# Patient Record
Sex: Male | Born: 1935 | Race: White | Hispanic: No | Marital: Married | Smoking: Former smoker
Health system: Southern US, Community
[De-identification: ages and names within clinical notes are randomized; demographics above are authoritative.]

## PROBLEM LIST (undated history)

## (undated) HISTORY — PX: VASECTOMY: SHX75

---

## 2003-07-01 ENCOUNTER — Ambulatory Visit (HOSPITAL_COMMUNITY): Admission: RE | Admit: 2003-07-01 | Discharge: 2003-07-01 | Payer: Self-pay | Admitting: Gastroenterology

## 2003-07-01 ENCOUNTER — Encounter (INDEPENDENT_AMBULATORY_CARE_PROVIDER_SITE_OTHER): Payer: Self-pay | Admitting: *Deleted

## 2004-11-07 ENCOUNTER — Emergency Department (HOSPITAL_COMMUNITY): Admission: EM | Admit: 2004-11-07 | Discharge: 2004-11-07 | Payer: Self-pay | Admitting: Family Medicine

## 2007-09-09 ENCOUNTER — Encounter: Admission: RE | Admit: 2007-09-09 | Discharge: 2007-09-09 | Payer: Self-pay | Admitting: Internal Medicine

## 2009-03-14 ENCOUNTER — Encounter: Admission: RE | Admit: 2009-03-14 | Discharge: 2009-03-14 | Payer: Self-pay | Admitting: Internal Medicine

## 2010-09-28 NOTE — Op Note (Signed)
NAME:  Jorge Ball, Jorge Ball                       ACCOUNT NO.:  [id number]   MEDICAL RECORD NO.:  [id number]                   PATIENT TYPE:  AMB   LOCATION:  ENDO                                 FACILITY:  [facility]   PHYSICIAN:  Petra Kuba, M.D.                 DATE OF BIRTH:  1927/01/05   DATE OF PROCEDURE:  07/01/2003  DATE OF DISCHARGE:                                 OPERATIVE REPORT   PROCEDURE:  Colonoscopy with polypectomy.   INDICATION:  Screening.  Consent was signed after risks, benefits, methods,  options thoroughly discussed in the office.   MEDICINES USED:  1. Demerol 90.  2. Versed 9.   DESCRIPTION OF PROCEDURE:  Rectal inspection was pertinent for external  hemorrhoids, small.  Digital exam was negative.  The video pediatric  adjustable colonoscope was inserted and about the level of the splenic  flexure, at about 60 cm, there were lots of diverticula in a very tortuous  curvy area which required rolling him first on his back and then on his  right side to be able to be advanced through this area.  Once through this  area, we were able to easily advance around the colon to the cecum.  Some  abdominal pressure was needed.  Other than the left-sided significant  diverticula, no other abnormalities were seen on insertion.  The cecum was  identified by the appendiceal orifice and the ileocecal valve.  Prep was  adequate.  There was some liquid stool that required washing and suctioning.  The scope was slowly withdrawn.  The cecum, ascending, and majority of the  transverse were normal.  There was a scattered transverse diverticula.  In  the more distal and mid transverse, two tiny questionable polyps were seen  and were each cold biopsied x 1 or 2 and put in the first container.  The  scope was further withdrawn.  We did not get a great look at the tortuous  splenic flexure area since we fell back around it and had difficulty  readvancing through it again, but we elected  to withdraw.  Other than the  left-sided moderate to significant diverticula, no other abnormalities were  seen until we withdrew back to the proximal rectum where a small, semi-  pedunculated polyp was seen, snared, electrocautery applied, and the polyp  was suctioned through the scope and collected in the trap after removal.  There was a nice white coagulum without any obvious residual polypoid  tissue.  The scope was slowly withdrawn.  Anorectal pull-through and  retroflexion confirmed some small hemorrhoids.  Scope was reinserted a short  ways up the left side of the colon; air was suctioned, scope removed.  The  patient tolerated the procedure adequately.  There was no obvious immediate  complication.   ENDOSCOPIC DIAGNOSES:  1. Internal-external hemorrhoids.  2. Proximal rectal small polyps, snared.  3. Left-sided significant diverticula.  4.  Particularly tortuous area of approximately the splenic flexure, not well     seen due to tortuosity, diverticula, edema, etc.  5. Questionable tiny to distal and mid transverse polyps, cold biopsied, put     in a separate container.  6. Otherwise, within normal limits to the cecum.   PLAN:  1. Await pathology to determine future colonic screening.  He might be a     good candidate for a virtual colonoscopy in the future if widely accepted     and returns to cover.  2. Otherwise, happy to see back p.r.n.  3. Otherwise, return care to Dr. Chilton Si for the customary health care     maintenance to include yearly rectals and guaiacs.                                               Petra Kuba, M.D.    MEM/MEDQ  D:  07/01/2003  T:  07/01/2003  Job:  [id number]   cc:   Erskine Speed, M.D.  [address]  [place]  [place] [address]  Fax: [phone]

## 2011-05-13 ENCOUNTER — Other Ambulatory Visit: Payer: Self-pay | Admitting: Internal Medicine

## 2011-05-13 ENCOUNTER — Ambulatory Visit
Admission: RE | Admit: 2011-05-13 | Discharge: 2011-05-13 | Disposition: A | Discharge status: Home / Self Care | Payer: Medicare Other | Source: Ambulatory Visit | Attending: Internal Medicine | Admitting: Internal Medicine

## 2011-05-13 DIAGNOSIS — R05 Cough: Secondary | ICD-10-CM

## 2011-06-17 ENCOUNTER — Other Ambulatory Visit: Payer: Self-pay | Admitting: Internal Medicine

## 2011-06-17 ENCOUNTER — Ambulatory Visit
Admission: RE | Admit: 2011-06-17 | Discharge: 2011-06-17 | Disposition: A | Discharge status: Home / Self Care | Payer: Medicare Other | Source: Ambulatory Visit | Attending: Internal Medicine | Admitting: Internal Medicine

## 2011-06-17 DIAGNOSIS — J189 Pneumonia, unspecified organism: Secondary | ICD-10-CM

## 2011-11-03 ENCOUNTER — Inpatient Hospital Stay (HOSPITAL_COMMUNITY)
Admission: EM | Admit: 2011-11-03 | Discharge: 2011-11-06 | DRG: 690 | Disposition: A | Discharge status: Home / Self Care | Payer: Medicare Other | Source: Ambulatory Visit | Attending: Internal Medicine | Admitting: Internal Medicine

## 2011-11-03 ENCOUNTER — Emergency Department (HOSPITAL_COMMUNITY): Payer: Medicare Other

## 2011-11-03 ENCOUNTER — Encounter (HOSPITAL_COMMUNITY): Payer: Self-pay | Admitting: *Deleted

## 2011-11-03 DIAGNOSIS — R4182 Altered mental status, unspecified: Secondary | ICD-10-CM

## 2011-11-03 DIAGNOSIS — D696 Thrombocytopenia, unspecified: Secondary | ICD-10-CM | POA: Diagnosis present

## 2011-11-03 DIAGNOSIS — R509 Fever, unspecified: Secondary | ICD-10-CM | POA: Diagnosis present

## 2011-11-03 DIAGNOSIS — E119 Type 2 diabetes mellitus without complications: Secondary | ICD-10-CM

## 2011-11-03 DIAGNOSIS — R7402 Elevation of levels of lactic acid dehydrogenase (LDH): Secondary | ICD-10-CM | POA: Diagnosis present

## 2011-11-03 DIAGNOSIS — D509 Iron deficiency anemia, unspecified: Secondary | ICD-10-CM | POA: Diagnosis present

## 2011-11-03 DIAGNOSIS — Z9181 History of falling: Secondary | ICD-10-CM

## 2011-11-03 DIAGNOSIS — R112 Nausea with vomiting, unspecified: Secondary | ICD-10-CM

## 2011-11-03 DIAGNOSIS — R7401 Elevation of levels of liver transaminase levels: Secondary | ICD-10-CM | POA: Diagnosis present

## 2011-11-03 DIAGNOSIS — M311 Thrombotic microangiopathy: Secondary | ICD-10-CM

## 2011-11-03 DIAGNOSIS — E782 Mixed hyperlipidemia: Secondary | ICD-10-CM

## 2011-11-03 DIAGNOSIS — W19XXXA Unspecified fall, initial encounter: Secondary | ICD-10-CM | POA: Diagnosis present

## 2011-11-03 DIAGNOSIS — Z7982 Long term (current) use of aspirin: Secondary | ICD-10-CM

## 2011-11-03 DIAGNOSIS — N390 Urinary tract infection, site not specified: Principal | ICD-10-CM | POA: Diagnosis present

## 2011-11-03 LAB — URINALYSIS, ROUTINE W REFLEX MICROSCOPIC
Bilirubin Urine: NEGATIVE
Glucose, UA: NEGATIVE mg/dL
Ketones, ur: 15 mg/dL — AB
Leukocytes, UA: NEGATIVE
Nitrite: NEGATIVE
Protein, ur: 100 mg/dL — AB
Protein, ur: 30 mg/dL — AB
Specific Gravity, Urine: 1.019 (ref 1.005–1.030)
Urobilinogen, UA: 1 mg/dL (ref 0.0–1.0)
Urobilinogen, UA: 1 mg/dL (ref 0.0–1.0)

## 2011-11-03 LAB — URINE MICROSCOPIC-ADD ON

## 2011-11-03 LAB — DIFFERENTIAL
Basophils Absolute: 0 10*3/uL (ref 0.0–0.1)
Eosinophils Absolute: 0 10*3/uL (ref 0.0–0.7)
Eosinophils Relative: 0 % (ref 0–5)
Lymphocytes Relative: 6 % — ABNORMAL LOW (ref 12–46)
Monocytes Absolute: 1 10*3/uL (ref 0.1–1.0)

## 2011-11-03 LAB — RETICULOCYTES
RBC.: 3.21 MIL/uL — ABNORMAL LOW (ref 4.22–5.81)
Retic Count, Absolute: 64.2 10*3/uL (ref 19.0–186.0)
Retic Ct Pct: 2 % (ref 0.4–3.1)

## 2011-11-03 LAB — COMPREHENSIVE METABOLIC PANEL
ALT: 33 U/L (ref 0–53)
AST: 88 U/L — ABNORMAL HIGH (ref 0–37)
CO2: 23 mEq/L (ref 19–32)
Calcium: 8.6 mg/dL (ref 8.4–10.5)
Creatinine, Ser: 0.95 mg/dL (ref 0.50–1.35)
GFR calc Af Amer: 87 mL/min — ABNORMAL LOW (ref >90)
GFR calc non Af Amer: 75 mL/min — ABNORMAL LOW (ref >90)
Sodium: 139 mEq/L (ref 135–145)
Total Protein: 6.4 g/dL (ref 6.0–8.3)

## 2011-11-03 LAB — CBC
HCT: 31.8 % — ABNORMAL LOW (ref 39.0–52.0)
MCH: 33 pg (ref 26.0–34.0)
MCHC: 31.8 g/dL (ref 30.0–36.0)
MCV: 103.9 fL — ABNORMAL HIGH (ref 78.0–100.0)
Platelets: 87 10*3/uL — ABNORMAL LOW (ref 150–400)
RDW: 13.9 % (ref 11.5–15.5)
WBC: 5.2 10*3/uL (ref 4.0–10.5)

## 2011-11-03 LAB — FOLATE: Folate: >20 ng/mL

## 2011-11-03 LAB — VITAMIN B12: Vitamin B-12: 743 pg/mL (ref 211–911)

## 2011-11-03 MED ORDER — SODIUM CHLORIDE 0.9 % IV SOLN
INTRAVENOUS | Status: DC
  Administered 2011-11-03: 75 mL via INTRAVENOUS
  Administered 2011-11-03: 19:00:00 via INTRAVENOUS

## 2011-11-03 MED ORDER — ACETAMINOPHEN 650 MG RE SUPP: 650.0000 mg | Freq: Four times a day (QID) | RECTAL | Status: DC | PRN

## 2011-11-03 MED ORDER — ACETAMINOPHEN 650 MG RE SUPP
650.0000 mg | Freq: Once | RECTAL | Status: AC
  Administered 2011-11-03: 650 mg via RECTAL
  Filled 2011-11-03: qty 1

## 2011-11-03 MED ORDER — ASPIRIN EC 81 MG PO TBEC
81.0000 mg | DELAYED_RELEASE_TABLET | Freq: Every day | ORAL | Status: DC
  Administered 2011-11-03 – 2011-11-06 (×4): 81 mg via ORAL
  Filled 2011-11-03 (×5): qty 1

## 2011-11-03 MED ORDER — POTASSIUM CHLORIDE 10 MEQ/100ML IV SOLN
10.0000 meq | INTRAVENOUS | Status: AC
  Administered 2011-11-03 (×2): 10 meq via INTRAVENOUS
  Filled 2011-11-03 (×2): qty 100

## 2011-11-03 MED ORDER — DEXTROSE 5 % IV SOLN
1.0000 g | INTRAVENOUS | Status: DC
  Administered 2011-11-03 – 2011-11-04 (×2): 1 g via INTRAVENOUS
  Filled 2011-11-03 (×3): qty 10

## 2011-11-03 MED ORDER — ENOXAPARIN SODIUM 30 MG/0.3ML ~~LOC~~ SOLN: 30.0000 mg | SUBCUTANEOUS | Status: DC

## 2011-11-03 MED ORDER — ONDANSETRON HCL 4 MG/2ML IJ SOLN: 4.0000 mg | Freq: Four times a day (QID) | INTRAMUSCULAR | Status: DC | PRN

## 2011-11-03 MED ORDER — ONDANSETRON HCL 4 MG PO TABS: 4.0000 mg | ORAL_TABLET | Freq: Four times a day (QID) | ORAL | Status: DC | PRN

## 2011-11-03 MED ORDER — SODIUM CHLORIDE 0.9 % IV SOLN
Freq: Once | INTRAVENOUS | Status: AC
  Administered 2011-11-03: 1000 mL via INTRAVENOUS

## 2011-11-03 MED ORDER — SODIUM CHLORIDE 0.9 % IJ SOLN
3.0000 mL | Freq: Two times a day (BID) | INTRAMUSCULAR | Status: DC
  Administered 2011-11-03 – 2011-11-04 (×2): 3 mL via INTRAVENOUS

## 2011-11-03 MED ORDER — DEXTROSE 5 % IV SOLN
1.0000 g | Freq: Once | INTRAVENOUS | Status: AC
  Administered 2011-11-03: 1 g via INTRAVENOUS
  Filled 2011-11-03: qty 10

## 2011-11-03 MED ORDER — ACETAMINOPHEN 325 MG PO TABS
650.0000 mg | ORAL_TABLET | Freq: Four times a day (QID) | ORAL | Status: DC | PRN
  Administered 2011-11-03 – 2011-11-04 (×3): 650 mg via ORAL
  Filled 2011-11-03 (×3): qty 2

## 2011-11-03 MED ORDER — SODIUM CHLORIDE 0.9 % IV SOLN: INTRAVENOUS | Status: DC

## 2011-11-03 MED ORDER — SODIUM CHLORIDE 0.9 % IV SOLN
INTRAVENOUS | Status: DC
  Administered 2011-11-03: 19:00:00 via INTRAVENOUS

## 2011-11-03 NOTE — ED Notes (Addendum)
Pt removed from backboard/C-collar by Dr Jeraldine Loots and 3 nursing staff.

## 2011-11-03 NOTE — ED Notes (Signed)
Pt with irregular HR, not afib.  New EKG printed and given to Dr Jeraldine Loots.

## 2011-11-03 NOTE — Evaluation (Signed)
Physical Therapy Evaluation Patient Details Name: Jorge Ball MRN: [id number] DOB: 14-Dec-1926 Today's Date: 11/03/2011 Time: 9528-4132 PT Time Calculation (min): 17 min  PT Assessment / Plan / Recommendation Clinical Impression  Pt presents to the hospital with multiple falls, increased weakness and impaired cognition, rule out UTI. Pt will benefit from skilled PT in the acute care setting in order to maximize functional mobility and safety prior to d/c    PT Assessment  Patient needs continued PT services    Follow Up Recommendations  Home health PT;Supervision/Assistance - 24 hour    Barriers to Discharge        lEquipment Recommendations  None recommended by PT    Recommendations for Other Services     Frequency Min 3X/week    Precautions / Restrictions Precautions Precautions: Fall Restrictions Weight Bearing Restrictions: No         Mobility  Bed Mobility Bed Mobility: Supine to Sit;Sitting - Scoot to Edge of Bed Supine to Sit: 3: Mod assist;With rails;HOB elevated Sitting - Scoot to Edge of Bed: 4: Min assist Details for Bed Mobility Assistance: VC for proper sequencing. Min assist for initiation of movement as well as for support into sitting through trunk Transfers Transfers: Sit to Stand;Stand to Sit Sit to Stand: 1: +2 Total assist;With upper extremity assist;From bed Sit to Stand: Patient Percentage: 50% Stand to Sit: 3: Mod assist;With upper extremity assist;To chair/3-in-1 Details for Transfer Assistance: VC for sequencing and hand placement. Assist x 2 for support into sitting as well as assistance with anterior translation. Ambulation/Gait Ambulation/Gait Assistance: 4: Min assist Ambulation Distance (Feet): 5 Feet Assistive device: Rolling walker Ambulation/Gait Assistance Details: Ambulated from bed to recliner. Min assist for stability with RW. Cues throughout for safety Gait Pattern: Step-to pattern;Decreased hip/knee flexion - right;Decreased  hip/knee flexion - left;Decreased stride length;Trunk flexed;Wide base of support    Exercises     PT Diagnosis: Difficulty walking;Altered mental status  PT Problem List: Decreased strength;Decreased activity tolerance;Decreased balance;Decreased mobility;Decreased knowledge of use of DME;Decreased safety awareness PT Treatment Interventions: DME instruction;Gait training;Stair training;Functional mobility training;Therapeutic activities;Therapeutic exercise;Balance training;Patient/family education   PT Goals Acute Rehab PT Goals PT Goal Formulation: With patient Time For Goal Achievement: 11/17/11 Potential to Achieve Goals: Fair Pt will go Supine/Side to Sit: with supervision PT Goal: Supine/Side to Sit - Progress: Goal set today Pt will go Sit to Supine/Side: with supervision PT Goal: Sit to Supine/Side - Progress: Goal set today Pt will go Sit to Stand: with min assist PT Goal: Sit to Stand - Progress: Goal set today Pt will go Stand to Sit: with min assist PT Goal: Stand to Sit - Progress: Goal set today Pt will Transfer Bed to Chair/Chair to Bed: with supervision PT Transfer Goal: Bed to Chair/Chair to Bed - Progress: Goal set today Pt will Ambulate: 51 - 150 feet;with supervision;with least restrictive assistive device PT Goal: Ambulate - Progress: Goal set today Pt will Go Up / Down Stairs: 3-5 stairs;with min assist;with least restrictive assistive device PT Goal: Up/Down Stairs - Progress: Goal set today  Visit Information  Last PT Received On: 11/03/11 Assistance Needed: +2    Subjective Data      Prior Functioning  Home Living Lives With: Spouse Available Help at Discharge: Family;Available 24 hours/day Type of Home: House Home Access: Stairs to enter [employer] of Steps: 3 Entrance Stairs-Rails: None Home Layout: One level Bathroom Shower/Tub: Walk-in shower;Door [employer] Toilet: Standard Bathroom Accessibility: Yes How Accessible: Accessible  via  walker Home Adaptive Equipment: Walker - rolling Prior Function Level of Independence: Independent Able to Take Stairs?: Yes Driving: Yes Vocation: Part time employment Comments: Paramedic Communication: HOH Dominant Hand: Right    Cognition  Overall Cognitive Status: Impaired Area of Impairment: Following commands;Safety/judgement;Awareness of deficits Arousal/Alertness: Awake/alert Orientation Level: Situation;Disoriented to Behavior During Session: [facility] for tasks performed Following Commands: Follows multi-step commands inconsistently Safety/Judgement: Impulsive;Decreased safety judgement for tasks assessed;Decreased awareness of need for assistance Awareness of Deficits: impaired    Extremity/Trunk Assessment Right Lower Extremity Assessment RLE ROM/Strength/Tone: Unable to fully assess;Due to impaired cognition (difficult following MMT instructions, strong knee and ankle) Left Lower Extremity Assessment LLE ROM/Strength/Tone: Unable to fully assess;Due to impaired cognition (difficulty following MMT instructions, strong knee and ankle)   Balance    End of Session PT - End of Session Equipment Utilized During Treatment: Gait belt Activity Tolerance: Treatment limited secondary to agitation Patient left: in chair;with call bell/phone within reach;with nursing in room Nurse Communication: Mobility status   Milana Kidney 11/03/2011, 5:02 PM  11/03/2011 Milana Kidney DPT PAGER: [phone] OFFICE: [phone]

## 2011-11-03 NOTE — H&P (Addendum)
PCP: Dr.Edwin Chilton Si  Chief Complaint:  Falls, fever  HPI: Jorge Ball is an 78 year old white male with no significant past medical history was in his usual state of health until yesterday. His family/wife reports 4-5 falls, weakness since yesterday. Patient denies any weakness or numbness in any particular slide, denies any trauma or changes in medications. His wife reports that yesterday while getting out of the car he fell down and couldn't quite help himself up. Then last night he fell out of bed as well. He reports chills yesterday, and feeling feverish yesterday as well as a temp of 102 today and while in the ER. Patient denies any urinary symptoms but the wife reports polyuria especially at night. Upon evaluation the ER he was suspected to have a urinary tract infection  Allergies:  No Known Allergies   History reviewed. No pertinent past medical history.  Past Surgical History  Procedure Date  . Vasectomy     Prior to Admission medications   Medication Sig Start Date End Date Taking? Authorizing Provider  aspirin EC 81 MG tablet Take 81 mg by mouth daily.   Yes Historical Provider, MD    Social History: Married lives at home with his wife, denies any history of tobacco use, drinks one beer and one hard drink a day  No family history on file.  Review of Systems:  Constitutional: Denies fever, chills, diaphoresis, appetite change and fatigue.  HEENT: Denies photophobia, eye pain, redness, hearing loss, ear pain, congestion, sore throat, rhinorrhea, sneezing, mouth sores, trouble swallowing, neck pain, neck stiffness and tinnitus.   Respiratory: Denies SOB, DOE, cough, chest tightness,  and wheezing.   Cardiovascular: Denies chest pain, palpitations and leg swelling.  Gastrointestinal: Denies nausea, vomiting, abdominal pain, diarrhea, constipation, blood in stool and abdominal distention.  Genitourinary: Denies dysuria, urgency, frequency, hematuria, flank pain and  difficulty urinating.  Musculoskeletal: Denies myalgias, back pain, joint swelling, arthralgias and gait problem.  Skin: Denies pallor, rash and wound.  Neurological: Denies dizziness, seizures, syncope, weakness, light-headedness, numbness and headaches.  Hematological: Denies adenopathy. Easy bruising, personal or family bleeding history  Psychiatric/Behavioral: Denies suicidal ideation, mood changes, confusion, nervousness, sleep disturbance and agitation   Physical Exam: Blood pressure 110/51, pulse 68, temperature 97.2 F (36.2 C), temperature source Oral, resp. rate 23, SpO2 96.00%. General exam:  Alert awake oriented x3 in no acute distress HEENT oral mucosa moist and pink neck no JVD or lymphadenopathy CVS S1-S2 regular rate rhythm no murmurs rubs or gallops Lungs clear auscultation bilaterally Abdomen soft nontender with normal bowel sounds no organomegaly no flank tenderness Extremities no edema clubbing or cyanosis Neuro strength 5 out 5 in all extremities proximal and distal muscle groups sensation to light touch intact all over Reflexes 1+ bilaterally Plantars withdrawal Coordination finger-nose intact Gait not assessed Labs on Admission:  Results for orders placed during the hospital encounter of 11/03/11 (from the past 48 hour(s))  CBC     Status: Abnormal   Collection Time   11/03/11  3:44 AM      Component Value Range Comment   WBC 5.2  4.0 - 10.5 K/uL    RBC 3.06 (*) 4.22 - 5.81 MIL/uL    Hemoglobin 10.1 (*) 13.0 - 17.0 g/dL    HCT 16.1 (*) 09.6 - 52.0 %    MCV 103.9 (*) 78.0 - 100.0 fL    MCH 33.0  26.0 - 34.0 pg    MCHC 31.8  30.0 - 36.0 g/dL    RDW  13.9  11.5 - 15.5 %    Platelets 87 (*) 150 - 400 K/uL PLATELET COUNT CONFIRMED BY SMEAR  DIFFERENTIAL     Status: Abnormal   Collection Time   11/03/11  3:44 AM      Component Value Range Comment   Neutrophils Relative 75  43 - 77 %    Neutro Abs 3.9  1.7 - 7.7 K/uL    Lymphocytes Relative 6 (*) 12 - 46 %     Lymphs Abs 0.3 (*) 0.7 - 4.0 K/uL    Monocytes Relative 19 (*) 3 - 12 %    Monocytes Absolute 1.0  0.1 - 1.0 K/uL    Eosinophils Relative 0  0 - 5 %    Eosinophils Absolute 0.0  0.0 - 0.7 K/uL    Basophils Relative 1  0 - 1 %    Basophils Absolute 0.0  0.0 - 0.1 K/uL   COMPREHENSIVE METABOLIC PANEL     Status: Abnormal   Collection Time   11/03/11  3:44 AM      Component Value Range Comment   Sodium 139  135 - 145 mEq/L    Potassium 3.2 (*) 3.5 - 5.1 mEq/L    Chloride 104  96 - 112 mEq/L    CO2 23  19 - 32 mEq/L    Glucose, Bld 119 (*) 70 - 99 mg/dL    BUN 14  6 - 23 mg/dL    Creatinine, Ser 1.61  0.50 - 1.35 mg/dL    Calcium 8.6  8.4 - 09.6 mg/dL    Total Protein 6.4  6.0 - 8.3 g/dL    Albumin 3.3 (*) 3.5 - 5.2 g/dL    AST 88 (*) 0 - 37 U/L    ALT 33  0 - 53 U/L    Alkaline Phosphatase 53  39 - 117 U/L    Total Bilirubin 0.7  0.3 - 1.2 mg/dL    GFR calc non Af Amer 75 (*) >90 mL/min    GFR calc Af Amer 87 (*) >90 mL/min   LACTIC ACID, PLASMA     Status: Normal   Collection Time   11/03/11  3:45 AM      Component Value Range Comment   Lactic Acid, Venous 0.8  0.5 - 2.2 mmol/L   URINALYSIS, ROUTINE W REFLEX MICROSCOPIC     Status: Abnormal   Collection Time   11/03/11  3:49 AM      Component Value Range Comment   Color, Urine YELLOW  YELLOW    APPearance CLOUDY (*) CLEAR    Specific Gravity, Urine 1.021  1.005 - 1.030    pH 6.0  5.0 - 8.0    Glucose, UA NEGATIVE  NEGATIVE mg/dL    Hgb urine dipstick LARGE (*) NEGATIVE    Bilirubin Urine NEGATIVE  NEGATIVE    Ketones, ur 15 (*) NEGATIVE mg/dL    Protein, ur 045 (*) NEGATIVE mg/dL    Urobilinogen, UA 1.0  0.0 - 1.0 mg/dL    Nitrite NEGATIVE  NEGATIVE    Leukocytes, UA NEGATIVE  NEGATIVE   URINE MICROSCOPIC-ADD ON     Status: Abnormal   Collection Time   11/03/11  3:49 AM      Component Value Range Comment   Squamous Epithelial / LPF RARE  RARE    RBC / HPF 3-6  <3 RBC/hpf    Casts GRANULAR CAST (*) NEGATIVE    Crystals  CA OXALATE CRYSTALS (*) NEGATIVE  Radiological Exams on Admission: Dg Chest 2 View  11/03/2011  *RADIOLOGY REPORT*  Clinical Data: Multiple falls; altered mental status.  CHEST - 2 VIEW  Comparison: Chest radiograph performed 06/17/2011  Findings: The lungs are well-aerated.  Mild vascular congestion is noted, without significant pulmonary edema.  There is no evidence of focal opacification, pleural effusion or pneumothorax.  The heart is normal in size; calcification is noted within the aortic arch.  No acute osseous abnormalities are seen.  IMPRESSION: Mild vascular congestion, without significant pulmonary edema.  No displaced rib fractures seen.  Original Report Authenticated By: Tonia Ghent, M.D.   Ct Head Wo Contrast  11/03/2011  *RADIOLOGY REPORT*  Clinical Data: Status post fall; altered mental status.  Concern for head injury.  CT HEAD WITHOUT CONTRAST  Technique:  Contiguous axial images were obtained from the base of the skull through the vertex without contrast.  Comparison: None.  Findings: There is no evidence of acute infarction, mass lesion, or intra- or extra-axial hemorrhage on CT.  Prominence of the ventricles and sulci reflects moderate cortical volume loss.  Cerebellar atrophy is noted.  Mild periventricular white matter change likely reflects small vessel ischemic microangiopathy.  The brainstem and fourth ventricle are within normal limits.  The basal ganglia are unremarkable in appearance.  The cerebral hemispheres demonstrate grossly normal gray-white differentiation. No mass effect or midline shift is seen.  There is no evidence of fracture; visualized osseous structures are unremarkable in appearance.  Bilateral proptosis is noted.  The paranasal sinuses and mastoid air cells are well-aerated.  No significant soft tissue abnormalities are seen.  Cerumen is noted within the right external auditory canal.  IMPRESSION:  1.  No evidence of traumatic intracranial injury or  fracture. 2.  Moderate cortical volume loss and scattered small vessel ischemic microangiopathy. 3.  Bilateral proptosis noted, of uncertain significance. 4.  Cerumen noted within the right external auditory canal.  Original Report Authenticated By: Tonia Ghent, M.D.    Assessment/Plan 1. FUO/Possible UTI, UA cloudy but WBC and bact counts not done from specimen, repeat UA Get Urine Cultures Empiric ceftriaxone Blood cultures if spikes Cmet, Cbc with Diff in am If persists and cause unclear will get ID input too 3. frequent Falls- due to acute illness/generalized weakness, get PT eval 4. Normocytic anemia:-reportedly chronic, colonoscopy 2 years ago per daughter normal, check anemia panel 5. Thrombocytopenia- not clear if acute or chronic,  peripheral smear rdered yesterday still pending will re-ordero Further management his condition evolves   Time Spent on Admission:  Brailon Don [facility] Pager: [phone] 11/03/2011, 10:51 AM

## 2011-11-03 NOTE — ED Notes (Signed)
EMS was called to home for fall.  They arrived to find pt on the floor, c/o L shoulder with multiple bruising on limbs.  Pt appeared to be altered.  Pt placed on backboard/C-collar. 12 lead showed nsr with pvc's.  CBG 125.

## 2011-11-03 NOTE — ED Notes (Addendum)
Wife and son have arrived.  Per family, pt has been disoriented for the last 2 days, stating that he is in the computer room when he is in the bedroom.  Up until Friday, the pt has been up, exercising, doing yard work and completing all of his adl's himself.  He continues to deny that he fell multiple times, but per family, he has fallen 5 times since Friday (though no loc).  Pt RR of 35 and sats of 90% on RA.  Pt placed on 2L O2.  Lung sounds clear bil.  Dr Jeraldine Loots is aware.

## 2011-11-03 NOTE — ED Notes (Signed)
Pt back from CT.  Placed on monitor.  Family at bedside.

## 2011-11-03 NOTE — ED Provider Notes (Addendum)
History     CSN: [id number]  Arrival date & time 11/03/11  0330   First MD Initiated Contact with Patient 11/03/11 [phone]      Chief Complaint  Patient presents with  . Fall  . Fever    (Consider location/radiation/quality/duration/timing/severity/associated sxs/prior treatment) HPI The patient presents after a fall with familial concerns over increasing frequency of falls.  Per EMS report the patient was found on the floor next to his bed by family members after rolling out of bed.  On their arrival EMS notes the patient complained of left shoulder pain, denied other complaints.  He has placed the patient on a backboard and c-collar.  The patient states that he always has left shoulder pain, denies any current change in that pain.  He denies any other focal complaints.  Notably, the patient is unclear on aspect of the recent history, denies any recent increase in frequency of falls.  This history of present illness is unreliable. History reviewed. No pertinent past medical history.  History reviewed. No pertinent past surgical history.  No family history on file.  History  Substance Use Topics  . Smoking status: Not on file  . Smokeless tobacco: Not on file  . Alcohol Use: Not on file      Review of Systems  Unable to perform ROS: Mental status change    Allergies  Review of patient's allergies indicates not on file.  Home Medications  No current outpatient prescriptions on file.  There were no vitals taken for this visit.  Physical Exam  Nursing note and vitals reviewed. Constitutional: He appears well-developed. He is active. Cervical collar and backboard in place.       On arrival the patient is noted to have a temperature of 103  HENT:  Head: Normocephalic. Head is with abrasion.  Eyes: Conjunctivae and EOM are normal.  Neck: Trachea normal. No spinous process tenderness and no muscular tenderness present. No rigidity. No erythema and normal range of motion  present.  Cardiovascular: Tachycardia present.   Pulmonary/Chest: Effort normal.  Abdominal: There is no tenderness. There is no rigidity, no rebound and no guarding.  Genitourinary: Testes normal and penis normal. Circumcised.       Immediate on arrival the patient notes he very much has to urinate.  He was provided a urinal presents approximately 50 mL of urine  Musculoskeletal:       Mild superficial ecchymotic changes in the upper extremities, but no gross deformities, nor any gross loss of range of motion or strength in any extremity  Neurological: He is alert.       Patient is awake and alert, but does not consistently participate in neurologic exam  Skin: Skin is warm.    ED Course  Procedures (including critical care time)   Labs Reviewed  URINALYSIS, ROUTINE W REFLEX MICROSCOPIC  CBC  DIFFERENTIAL  COMPREHENSIVE METABOLIC PANEL  LACTIC ACID, PLASMA   No results found.   No diagnosis found.  Cardiac: 91 sr , normal  Pulse ox 95% ra, borderline   Date: 11/03/2011  Rate: 80  Rhythm: normal sinus rhythm  QRS Axis: left  Intervals: normal  ST/T Wave abnormalities: nonspecific T wave changes  Conduction Disutrbances:right bundle branch block and left anterior fascicular block  Narrative Interpretation:   Old EKG Reviewed: none available ABNORMAL   The patient's family arrived following his initial evaluation in the return of initial labs.  The patient's wife and his son both noted until approximately 24 hours  ago the patient was in his usual state of health.  Over the recent timeframe he has had at least 5 falls attributed to weakness without syncope.  He seems to be interacting in a similar fashion, but his weakness is notably different according to them.  On repeat evaluation the patient is afebrile, sleeping MDM  This elderly male presents with new increased frequency of falls, and on initial exam the patient is febrile, tachycardic.  Given these findings there  is immediate suspicion of ongoing infectious etiology.  The patient's chest x-ray was clear, and his urinalysis demonstrated findings, consistent with cystitis.  The patient's O2 sat's were in the mid-90's, though there was no radiographic e/o pna.  The patient's CT brain did not demonstrate acute findings.  In following IV fluids, ceftriaxone for presumed UTI, the patient defervesced.  Given this new acute change in condition and the patient was admitted for further evaluation and management.  Gerhard Munch, MD 11/03/11 1610  Gerhard Munch, MD 11/03/11 [id number]  Gerhard Munch, MD 11/03/11 [phone]

## 2011-11-03 NOTE — ED Notes (Signed)
Pt resting quietly at the time. Remains on cardiac monitor. Vital signs stable. Family at bedside. Denies pain at the time.

## 2011-11-04 ENCOUNTER — Inpatient Hospital Stay (HOSPITAL_COMMUNITY): Payer: Medicare Other

## 2011-11-04 DIAGNOSIS — E119 Type 2 diabetes mellitus without complications: Secondary | ICD-10-CM

## 2011-11-04 DIAGNOSIS — R509 Fever, unspecified: Secondary | ICD-10-CM

## 2011-11-04 DIAGNOSIS — R112 Nausea with vomiting, unspecified: Secondary | ICD-10-CM

## 2011-11-04 DIAGNOSIS — M311 Thrombotic microangiopathy: Secondary | ICD-10-CM

## 2011-11-04 LAB — CBC
HCT: 31.1 % — ABNORMAL LOW (ref 39.0–52.0)
Hemoglobin: 10 g/dL — ABNORMAL LOW (ref 13.0–17.0)
MCH: 33.6 pg (ref 26.0–34.0)
MCHC: 32.2 g/dL (ref 30.0–36.0)
RDW: 13.9 % (ref 11.5–15.5)

## 2011-11-04 LAB — URINE CULTURE

## 2011-11-04 LAB — COMPREHENSIVE METABOLIC PANEL
Albumin: 3 g/dL — ABNORMAL LOW (ref 3.5–5.2)
BUN: 17 mg/dL (ref 6–23)
Calcium: 8.5 mg/dL (ref 8.4–10.5)
GFR calc Af Amer: >90 mL/min (ref >90)
Glucose, Bld: 83 mg/dL (ref 70–99)
Potassium: 3.6 mEq/L (ref 3.5–5.1)
Total Protein: 6 g/dL (ref 6.0–8.3)

## 2011-11-04 MED ORDER — IOHEXOL 300 MG/ML  SOLN
20.0000 mL | INTRAMUSCULAR | Status: AC
  Administered 2011-11-04 (×2): 20 mL via ORAL

## 2011-11-04 MED ORDER — DOXYCYCLINE HYCLATE 100 MG IV SOLR
100.0000 mg | Freq: Two times a day (BID) | INTRAVENOUS | Status: DC
  Administered 2011-11-04: 100 mg via INTRAVENOUS
  Filled 2011-11-04 (×4): qty 100

## 2011-11-04 MED ORDER — IOHEXOL 300 MG/ML  SOLN: 100.0000 mL | Freq: Once | INTRAMUSCULAR | Status: AC | PRN

## 2011-11-04 NOTE — Progress Notes (Signed)
Subjective: Feels ok, denies cough, congestion, SoB, N/V/D,urinary symptoms Febrile yesterday  Objective: Vital signs in last 24 hours: Temp:  [98.3 F (36.8 C)-99 F (37.2 C)] 99 F (37.2 C) (06/24 0500) Pulse Rate:  [62-91] 91  (06/24 0500) Resp:  [20-22] 20  (06/24 0500) BP: (102-123)/(59-69) 123/69 mmHg (06/24 0500) SpO2:  [94 %-95 %] 94 % (06/24 0500) Weight change:  Last BM Date: 11/01/11  Intake/Output from previous day: 06/23 0701 - 06/24 0700 In: 3 [I.V.:3] Out: -      Physical Exam: General: Alert, awake, oriented x3, in no acute distress. HEENT: No bruits, no goiter. Heart: Regular rate and rhythm, without murmurs, rubs, gallops. Lungs: Clear to auscultation bilaterally. Abdomen: Soft, nontender, nondistended, positive bowel sounds. Extremities: No clubbing cyanosis or edema with positive pedal pulses. Neuro: Grossly intact, nonfocal.    Lab Results: Basic Metabolic Panel:  Basename 11/04/11 0510 11/03/11 0344  NA 141 139  K 3.6 3.2*  CL 108 104  CO2 20 23  GLUCOSE 83 119*  BUN 17 14  CREATININE 0.86 0.95  CALCIUM 8.5 8.6  MG -- --  PHOS -- --   Liver Function Tests:  Basename 11/04/11 0510 11/03/11 0344  AST 166* 88*  ALT 75* 33  ALKPHOS 48 53  BILITOT 0.7 0.7  PROT 6.0 6.4  ALBUMIN 3.0* 3.3*   No results found for this basename: LIPASE:2,AMYLASE:2 in the last 72 hours No results found for this basename: AMMONIA:2 in the last 72 hours CBC:  Basename 11/04/11 0510 11/03/11 0344  WBC 5.9 5.2  NEUTROABS -- 3.9  HGB 10.0* 10.1*  HCT 31.1* 31.8*  MCV 104.4* 103.9*  PLT 70* 87*   Cardiac Enzymes: No results found for this basename: CKTOTAL:3,CKMB:3,CKMBINDEX:3,TROPONINI:3 in the last 72 hours BNP: No results found for this basename: PROBNP:3 in the last 72 hours D-Dimer: No results found for this basename: DDIMER:2 in the last 72 hours CBG: No results found for this basename: GLUCAP:6 in the last 72 hours Hemoglobin A1C: No  results found for this basename: HGBA1C in the last 72 hours Fasting Lipid Panel: No results found for this basename: CHOL,HDL,LDLCALC,TRIG,CHOLHDL,LDLDIRECT in the last 72 hours Thyroid Function Tests: No results found for this basename: TSH,T4TOTAL,FREET4,T3FREE,THYROIDAB in the last 72 hours Anemia Panel:  Basename 11/03/11 1130  VITAMINB12 743  FOLATE >20.0  FERRITIN 1877*  TIBC 267  IRON 17*  RETICCTPCT 2.0   Coagulation: No results found for this basename: LABPROT:2,INR:2 in the last 72 hours Urine Drug Screen: Drugs of Abuse  No results found for this basename: labopia, cocainscrnur, labbenz, amphetmu, thcu, labbarb    Alcohol Level: No results found for this basename: ETH:2 in the last 72 hours Urinalysis:  Basename 11/03/11 1454 11/03/11 0349  COLORURINE AMBER* YELLOW  LABSPEC 1.019 1.021  PHURINE 5.5 6.0  GLUCOSEU NEGATIVE NEGATIVE  HGBUR LARGE* LARGE*  BILIRUBINUR NEGATIVE NEGATIVE  KETONESUR 15* 15*  PROTEINUR 30* 100*  UROBILINOGEN 1.0 1.0  NITRITE NEGATIVE NEGATIVE  LEUKOCYTESUR NEGATIVE NEGATIVE    Recent Results (from the past 240 hour(s))  URINE CULTURE     Status: Normal   Collection Time   11/03/11  3:49 AM      Component Value Range Status Comment   Specimen Description URINE, CLEAN CATCH   Final    Special Requests CX ADDED AT 0657   Final    Culture  Setup Time [id number]   Final    Colony Count 4,000 COLONIES/ML   Final    Culture  INSIGNIFICANT GROWTH   Final    Report Status 11/04/2011 FINAL   Final     Studies/Results: Dg Chest 2 View  11/03/2011  *RADIOLOGY REPORT*  Clinical Data: Multiple falls; altered mental status.  CHEST - 2 VIEW  Comparison: Chest radiograph performed 06/17/2011  Findings: The lungs are well-aerated.  Mild vascular congestion is noted, without significant pulmonary edema.  There is no evidence of focal opacification, pleural effusion or pneumothorax.  The heart is normal in size; calcification is noted within the  aortic arch.  No acute osseous abnormalities are seen.  IMPRESSION: Mild vascular congestion, without significant pulmonary edema.  No displaced rib fractures seen.  Original Report Authenticated By: Tonia Ghent, M.D.   Ct Head Wo Contrast  11/03/2011  *RADIOLOGY REPORT*  Clinical Data: Status post fall; altered mental status.  Concern for head injury.  CT HEAD WITHOUT CONTRAST  Technique:  Contiguous axial images were obtained from the base of the skull through the vertex without contrast.  Comparison: None.  Findings: There is no evidence of acute infarction, mass lesion, or intra- or extra-axial hemorrhage on CT.  Prominence of the ventricles and sulci reflects moderate cortical volume loss.  Cerebellar atrophy is noted.  Mild periventricular white matter change likely reflects small vessel ischemic microangiopathy.  The brainstem and fourth ventricle are within normal limits.  The basal ganglia are unremarkable in appearance.  The cerebral hemispheres demonstrate grossly normal gray-white differentiation. No mass effect or midline shift is seen.  There is no evidence of fracture; visualized osseous structures are unremarkable in appearance.  Bilateral proptosis is noted.  The paranasal sinuses and mastoid air cells are well-aerated.  No significant soft tissue abnormalities are seen.  Cerumen is noted within the right external auditory canal.  IMPRESSION:  1.  No evidence of traumatic intracranial injury or fracture. 2.  Moderate cortical volume loss and scattered small vessel ischemic microangiopathy. 3.  Bilateral proptosis noted, of uncertain significance. 4.  Cerumen noted within the right external auditory canal.  Original Report Authenticated By: Tonia Ghent, M.D.    Medications: Scheduled Meds:   . aspirin EC  81 mg Oral Daily  . cefTRIAXone (ROCEPHIN)  IV  1 g Intravenous Q24H  . iohexol  20 mL Oral Q1 Hr x 2  . sodium chloride  3 mL Intravenous Q12H   Continuous Infusions:   . sodium  chloride 50 mL/hr at 11/04/11 1014  . DISCONTD: sodium chloride 75 mL/hr at 11/03/11 1900   PRN Meds:.acetaminophen, acetaminophen, iohexol, ondansetron (ZOFRAN) IV, ondansetron  Assessment/Plan:  1. FUO Doubt UTI: Urine cultures negative,  Fevers down, ?DC ceftriaxone Check CT Chest/Abd/Pelvis to r/o occult infection malignancy LFTs mildly elevated ? Viral illness/tick borne Will request ID consultation 2. Iron deficiency anemia Colonoscopy 2 years ago, normal 3. Thrombocytopenia: unknown if chronic or acute Peripheral smear ordered yesterday still pending will re-order 4. Falls secondary to fevers/generalized weakness PT following 5. DVT prophylaxis: SCDs    LOS: 1 day   [facility] Pager: [phone] 11/04/2011, 2:42 PM

## 2011-11-04 NOTE — Progress Notes (Signed)
Physical Therapy Treatment Patient Details Name: Jorge Ball MRN: [id number] DOB: October 22, 1926 Today's Date: 11/04/2011 Time: 4098-1191 PT Time Calculation (min): 21 min  PT Assessment / Plan / Recommendation Comments on Treatment Session  Patient is progressing well with his mobility. However, 24 hour care would be beneficial to ensure his safety.     Follow Up Recommendations  Supervision/Assistance - 24 hour;Home health PT    Barriers to Discharge  None      Equipment Recommendations  None recommended by PT    Recommendations for Other Services  None  Frequency Min 3X/week   Plan Discharge plan remains appropriate;Frequency remains appropriate    Precautions / Restrictions Precautions Precautions: Fall Restrictions Weight Bearing Restrictions: No   Pertinent Vitals/Pain VSS/ No pain    Mobility  Bed Mobility Bed Mobility: Rolling Left;Left Sidelying to Sit Rolling Left: 6: Modified independent (Device/Increase time) Left Sidelying to Sit: 4: Min assist Details for Bed Mobility Assistance: Verbal cues for technique as patient not rotating upper trunk with side to sit and reaching back behind self to push from behind.  Transfers Sit to Stand: 4: Min assist;With upper extremity assist;From bed;From elevated surface Stand to Sit: 5: Supervision;With upper extremity assist;To chair/3-in-1 Details for Transfer Assistance: Patient >90% of stand himself. Surface elevated and patient required verbal cues for hand placement. Ambulation/Gait Ambulation/Gait Assistance: 4: Min guard Ambulation Distance (Feet):  (2 x 30 feet) Ambulation/Gait Assistance Details: with walker and then without. Patient with some decreased safety secondary to impulsivity. Patient self limiting distance. declines ambulating out of room.  Gait Pattern: Step-through pattern;Decreased stride length;Trunk flexed;Wide base of support     PT Goals Acute Rehab PT Goals PT Goal: Supine/Side to Sit -  Progress: Progressing toward goal PT Goal: Sit to Stand - Progress: Met PT Goal: Stand to Sit - Progress: Met PT Goal: Ambulate - Progress: Progressing toward goal  Visit Information  Last PT Received On: 11/04/11 Assistance Needed: +1    Subjective Data  Subjective: Patient reports feeling stronger Patient Stated Goal: Home tomorrow   Cognition  Area of Impairment: Safety/judgement Arousal/Alertness: Awake/alert Orientation Level: Appears intact for tasks assessed Safety/Judgement: Decreased safety judgement for tasks assessed Safety/Judgement - Other Comments: Impulsive    Balance   No evidence of imbalance  End of Session PT - End of Session Equipment Utilized During Treatment: Gait belt Activity Tolerance: Patient tolerated treatment well Patient left: in chair Nurse Communication: Mobility status    Edwyna Perfect, PT  Pager [phone]  11/04/2011, 9:01 AM
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
## 2011-11-05 DIAGNOSIS — R509 Fever, unspecified: Secondary | ICD-10-CM

## 2011-11-05 DIAGNOSIS — R112 Nausea with vomiting, unspecified: Secondary | ICD-10-CM

## 2011-11-05 DIAGNOSIS — M311 Thrombotic microangiopathy, unspecified: Secondary | ICD-10-CM

## 2011-11-05 DIAGNOSIS — E119 Type 2 diabetes mellitus without complications: Secondary | ICD-10-CM

## 2011-11-05 LAB — COMPREHENSIVE METABOLIC PANEL
CO2: 21 mEq/L (ref 19–32)
Calcium: 8.6 mg/dL (ref 8.4–10.5)
Creatinine, Ser: 0.79 mg/dL (ref 0.50–1.35)
GFR calc Af Amer: >90 mL/min (ref >90)
GFR calc non Af Amer: 81 mL/min — ABNORMAL LOW (ref >90)
Glucose, Bld: 84 mg/dL (ref 70–99)
Total Bilirubin: 0.7 mg/dL (ref 0.3–1.2)

## 2011-11-05 LAB — CBC
Hemoglobin: 11.3 g/dL — ABNORMAL LOW (ref 13.0–17.0)
MCHC: 32.1 g/dL (ref 30.0–36.0)
Platelets: 60 10*3/uL — ABNORMAL LOW (ref 150–400)
RBC: 3.44 MIL/uL — ABNORMAL LOW (ref 4.22–5.81)

## 2011-11-05 LAB — DIFFERENTIAL
Basophils Relative: 2 % — ABNORMAL HIGH (ref 0–1)
Eosinophils Absolute: 0 10*3/uL (ref 0.0–0.7)
Eosinophils Relative: 0 % (ref 0–5)
Lymphocytes Relative: 19 % (ref 12–46)
Neutrophils Relative %: 38 % — ABNORMAL LOW (ref 43–77)

## 2011-11-05 LAB — PATHOLOGIST SMEAR REVIEW

## 2011-11-05 MED ORDER — DOXYCYCLINE HYCLATE 100 MG PO TABS
100.0000 mg | ORAL_TABLET | Freq: Two times a day (BID) | ORAL | Status: DC
  Administered 2011-11-05 – 2011-11-06 (×2): 100 mg via ORAL
  Filled 2011-11-05 (×4): qty 1

## 2011-11-05 NOTE — Progress Notes (Signed)
Physical Therapy Treatment Patient Details Name: Jorge Ball MRN: [id number] DOB: April 03, 1927 Today's Date: 11/05/2011 Time: 4098-1191 PT Time Calculation (min): 23 min  PT Assessment / Plan / Recommendation Comments on Treatment Session  Patient has improved functional activity tolerance and balance, but his safety remains an issue due to poor decision making.     Follow Up Recommendations  Supervision/Assistance - 24 hour;Home health PT    Barriers to Discharge  None      Equipment Recommendations  None recommended by PT    Recommendations for Other Services  None  Frequency Min 3X/week   Plan Discharge plan remains appropriate;Frequency remains appropriate    Precautions / Restrictions Precautions Precautions: Fall Precaution Comments: Patient states recent fall have been trips or falls when trying to get out of his car.  Restrictions Weight Bearing Restrictions: No   Pertinent Vitals/Pain VSS/ No pain reported    Mobility  Bed Mobility Rolling Left: 6: Modified independent (Device/Increase time);With rail Left Sidelying to Sit: With rails;6: Modified independent (Device/Increase time);HOB elevated Sitting - Scoot to Edge of Bed: 6: Modified independent (Device/Increase time) Details for Bed Mobility Assistance: Increased time for all tasks.  Transfers Sit to Stand: 4: Min guard;With upper extremity assist;From bed Stand to Sit: To chair/3-in-1;5: Supervision;With upper extremity assist;With armrests Details for Transfer Assistance: Noted patient has difficulty with initiating stand from lower height surface. Educated on improved initiation.  Ambulation/Gait Ambulation/Gait Assistance: 4: Min guard Ambulation Distance (Feet): 200 Feet Assistive device: Rolling walker Ambulation/Gait Assistance Details: Patient with poor safety as carries walker at times despite cues for correct use. Improved stride length, speed, and posture.  Gait Pattern: Within Functional Limits      Exercises General Exercises - Lower Extremity Quad Sets: AROM;Both;10 reps;Seated Long Arc Quad: AROM;Both;10 reps;Seated Hip Flexion/Marching: AROM;Both;10 reps;Standing Heel Raises: AROM;Both;10 reps;Standing Mini-Sqauts: AROM;Both;10 reps;Standing    PT Goals Acute Rehab PT Goals PT Goal: Supine/Side to Sit - Progress: Met Pt will go Sit to Stand: with modified independence;with upper extremity assist PT Goal: Sit to Stand - Progress: Updated due to goal met Pt will go Stand to Sit: with modified independence;with upper extremity assist PT Goal: Stand to Sit - Progress: Updated due to goals met PT Goal: Ambulate - Progress: Progressing toward goal  Visit Information  Last PT Received On: 11/05/11 Assistance Needed: +1    Subjective Data  Subjective: Patient reports that he does not wish to receive any further medical treatment until he has a diagnosis Patient Stated Goal: Home and wait for blood test results   Cognition  Area of Impairment: Safety/judgement Orientation Level: Appears intact for tasks assessed Behavior During Session:  (self directs session) Safety/Judgement: Decreased safety judgement for tasks assessed;Decreased awareness of safety precautions Safety/Judgement - Other Comments: Will carry walker in the middle of his ambulation in the halls and stagger and then repeat despite cues for safety.     Balance  High Level Balance High Level Balance Comments: Worked with patient on semi-tandem stance x 30 seconds and romberg 30 seconds with eyes open and closed. No evidence of imbalance until activity with eyes closed then immediate loss of balance to the left and required minmal assistance to maintain.   End of Session PT - End of Session Equipment Utilized During Treatment: Gait belt Activity Tolerance: Patient tolerated treatment well Patient left: in chair;with call bell/phone within reach Nurse Communication: Mobility status    Edwyna Perfect, PT   Pager [phone]  11/05/2011, 7:57 AM

## 2011-11-05 NOTE — Progress Notes (Signed)
INFECTIOUS DISEASE PROGRESS NOTE  ID: Jorge Ball is a 78 y.o. male with chronic anemia presents with fevers and  Possible episodes of syncope, found to have thrombocytopenia and elevated transaminitis  Subjective: Had low grade fever of 100.4 last night, patient asymptomatic. He feels that antibiotics disagree with him and would not like to take them. He doesn't recall having any reaction to receiving antibiotics yesterday. He is stating that he doesn't want to take any further medicine until he sees his PCP, Dr. Chilton Si.  Dr. Jomarie Longs has checked in with PCP, his last CBC in April, noted to have baseline plt of 240K.  Abtx:  Anti-infectives     Start     Dose/Rate Route Frequency Ordered Stop   11/05/11 1300   doxycycline (VIBRA-TABS) tablet 100 mg        100 mg Oral Every 12 hours 11/05/11 1159     11/04/11 1745   doxycycline (VIBRAMYCIN) 100 mg in dextrose 5 % 250 mL IVPB  Status:  Discontinued        100 mg 125 mL/hr over 120 Minutes Intravenous Every 12 hours 11/04/11 1730 11/05/11 1159   11/03/11 1200   cefTRIAXone (ROCEPHIN) 1 g in dextrose 5 % 50 mL IVPB  Status:  Discontinued        1 g 100 mL/hr over 30 Minutes Intravenous Every 24 hours 11/03/11 1104 11/05/11 1119   11/03/11 0515   cefTRIAXone (ROCEPHIN) 1 g in dextrose 5 % 50 mL IVPB        1 g 100 mL/hr over 30 Minutes Intravenous  Once 11/03/11 0503 11/03/11 0616          Medications: reviewed  Objective: Vital signs in last 24 hours: Temp:  [98.1 F (36.7 C)-100.4 F (38 C)] 98.2 F (36.8 C) (06/25 0500) Pulse Rate:  [76-89] 76  (06/25 0500) Resp:  [20-22] 20  (06/25 0500) BP: (98-128)/(61-71) 119/68 mmHg (06/25 0500) SpO2:  [94 %-95 %] 94 % (06/25 0500)   Physical Exam  Constitutional: He is oriented to person, place, and time. He appears well-developed and well-nourished. No distress.  HENT:  Mouth/Throat: Oropharynx is clear and moist. No oropharyngeal exudate.  Cardiovascular: Normal rate, regular  rhythm and normal heart sounds. Exam reveals no gallop and no friction rub.  No murmur heard.  Pulmonary/Chest: Effort normal and breath sounds normal. No respiratory distress. He has no wheezes.  Abdominal: Soft. Bowel sounds are normal. He exhibits no distension. There is no tenderness.  Lymphadenopathy:  He has no cervical adenopathy.  Neurological: He is alert and oriented to person, place, and time.  Skin: Skin is warm and dry. No rash noted. No erythema.  Psychiatric: He has a normal mood and affect. His behavior is normal.    Lab Results  [facility] 11/05/11 0515 11/04/11 0510  WBC 6.3 5.9  HGB 11.3* 10.0*  HCT 35.2* 31.1*  NA 136 141  K 3.7 3.6  CL 104 108  CO2 21 20  BUN 16 17  CREATININE 0.79 0.86  GLU -- --   Liver Panel  Basename 11/05/11 0515 11/04/11 0510  PROT 6.2 6.0  ALBUMIN 3.1* 3.0*  AST 250* 166*  ALT 132* 75*  ALKPHOS 58 48  BILITOT 0.7 0.7  BILIDIR -- --  IBILI -- --   Sedimentation Rate  Basename 11/04/11 1047  ESRSEDRATE 9   C-Reactive Protein  Basename 11/04/11 1047  CRP 12.66*    Microbiology: Recent Results (from the past 240 hour(s))  URINE CULTURE     Status: Normal   Collection Time   11/03/11  3:49 AM      Component Value Range Status Comment   Specimen Description URINE, CLEAN CATCH   Final    Special Requests CX ADDED AT 0657   Final    Culture  Setup Time [id number]   Final    Colony Count 4,000 COLONIES/ML   Final    Culture INSIGNIFICANT GROWTH   Final    Report Status 11/04/2011 FINAL   Final   CULTURE, BLOOD (ROUTINE X 2)     Status: Normal (Preliminary result)   Collection Time   11/03/11  5:00 PM      Component Value Range Status Comment   Specimen Description BLOOD RIGHT ARM   Final    Special Requests BOTTLES DRAWN AEROBIC AND ANAEROBIC   Final    Culture  Setup Time [id number]   Final    Culture     Final    Value:        BLOOD CULTURE RECEIVED NO GROWTH TO DATE CULTURE WILL BE HELD FOR 5 DAYS BEFORE  ISSUING A FINAL NEGATIVE REPORT   Report Status PENDING   Incomplete   CULTURE, BLOOD (ROUTINE X 2)     Status: Normal (Preliminary result)   Collection Time   11/03/11  5:20 PM      Component Value Range Status Comment   Specimen Description BLOOD RIGHT HAND   Final    Special Requests BOTTLES DRAWN AEROBIC AND ANAEROBIC   Final    Culture  Setup Time [id number]   Final    Culture     Final    Value:        BLOOD CULTURE RECEIVED NO GROWTH TO DATE CULTURE WILL BE HELD FOR 5 DAYS BEFORE ISSUING A FINAL NEGATIVE REPORT   Report Status PENDING   Incomplete     Studies/Results: Ct Chest W Contrast  11/04/2011  *RADIOLOGY REPORT*  Clinical Data:  Fever of unknown origin.  CT CHEST, ABDOMEN AND PELVIS WITH CONTRAST  Technique:  Multidetector CT imaging of the chest, abdomen and pelvis was performed following the standard protocol during bolus administration of intravenous contrast.  Contrast:  100 ml Omnipaque-300  Comparison:   None.  CT CHEST  Findings:  There is no axillary, mediastinal, or hilar lymphadenopathy.  Heart size is at upper normal.  There is no pericardial or pleural effusion.  There is some subtle ill-defined soft tissue attenuation in the anterior mediastinal fat (see image 34 of series 2).  This is of indeterminate significance.  Atelectasis is seen in the posterior right lung base.  Left lung is clear.  Bone windows reveal no worrisome lytic or sclerotic osseous lesions.  IMPRESSION: No acute findings in the chest.  There is some subtle soft tissue attenuation in the anterior mediastinal fat it is indeterminate, but likely benign.  CT ABDOMEN AND PELVIS  Findings:  No focal abnormalities seen in the liver.  Spleen is unremarkable.  The stomach, duodenum, pancreas, gallbladder, and adrenal glands are unremarkable.  4 cm water density lesion in the right kidney is compatible with a cyst.  There is some cortical scarring in the left kidney.  No abdominal aortic aneurysm.  There is  no free fluid or lymphadenopathy in the abdomen.  Abdominal bowel loops are unremarkable.  Imaging through the pelvis shows no free intraperitoneal fluid.  No pelvic sidewall lymphadenopathy.  Left lower quadrant hernia contains a  loop of sigmoid colon without complicating features. There is diverticular change diffusely in the left colon without diverticulitis.  Terminal ileum is unremarkable.  The appendix is normal  Bone windows reveal no worrisome lytic or sclerotic osseous lesions.  IMPRESSION:  No findings to explain the patient's history of fever.  Original Report Authenticated By: ERIC A. MANSELL, M.D.   Ct Abdomen Pelvis W Contrast  11/04/2011  *RADIOLOGY REPORT*  Clinical Data:  Fever of unknown origin.  CT CHEST, ABDOMEN AND PELVIS WITH CONTRAST  Technique:  Multidetector CT imaging of the chest, abdomen and pelvis was performed following the standard protocol during bolus administration of intravenous contrast.  Contrast:  100 ml Omnipaque-300  Comparison:   None.  CT CHEST  Findings:  There is no axillary, mediastinal, or hilar lymphadenopathy.  Heart size is at upper normal.  There is no pericardial or pleural effusion.  There is some subtle ill-defined soft tissue attenuation in the anterior mediastinal fat (see image 34 of series 2).  This is of indeterminate significance.  Atelectasis is seen in the posterior right lung base.  Left lung is clear.  Bone windows reveal no worrisome lytic or sclerotic osseous lesions.  IMPRESSION: No acute findings in the chest.  There is some subtle soft tissue attenuation in the anterior mediastinal fat it is indeterminate, but likely benign.  CT ABDOMEN AND PELVIS  Findings:  No focal abnormalities seen in the liver.  Spleen is unremarkable.  The stomach, duodenum, pancreas, gallbladder, and adrenal glands are unremarkable.  4 cm water density lesion in the right kidney is compatible with a cyst.  There is some cortical scarring in the left kidney.  No abdominal  aortic aneurysm.  There is no free fluid or lymphadenopathy in the abdomen.  Abdominal bowel loops are unremarkable.  Imaging through the pelvis shows no free intraperitoneal fluid.  No pelvic sidewall lymphadenopathy.  Left lower quadrant hernia contains a loop of sigmoid colon without complicating features. There is diverticular change diffusely in the left colon without diverticulitis.  Terminal ileum is unremarkable.  The appendix is normal  Bone windows reveal no worrisome lytic or sclerotic osseous lesions.  IMPRESSION:  No findings to explain the patient's history of fever.  Original Report Authenticated By: ERIC A. MANSELL, M.D.     Assessment/Plan: 78yo Male with fever, thrombocytopenia, and transaminitis which are worsening, now plt 60 and alt/ast are 132/250. Undergoing FUO work-up.   1) concern for tick borne illness, will switch to oral doxycycline 100mg  BID to see if he can tolerate with food. Would continue empiric treatment until we have IgG  And IgM for RMSF return. Would tx for 7 days  2) after 2 days of therapy, usually see improvement in thrombocytopenia. Patient has only received  1 dose thus far.  3) await results from FUO work-up, chest/abd/pelvis negative  4) can discontinue ceftriaxone since no bacteremia nor uti.  5) recommend to discuss plan with patient's family and pcp so that they can convince patient to take medications.  6) if family reports that this exhibition is not his baseline, may need to consider getting LP to rule out other CNS infection such as herpes encephalopathy, which generally makes someone altered, not necessarily obstinate, as this patient is exhibiting.  Ramanda Paules Infectious Diseases 11/05/2011, 1:47 PM

## 2011-11-05 NOTE — Progress Notes (Signed)
Pt continues to refuse IV antibiotics stating that he "only takes penicillins". Dr Jomarie Longs on the floor and made aware. Will continue to monitor. Ramond Craver, Rn

## 2011-11-05 NOTE — Progress Notes (Signed)
Subjective: Feels ok, denies cough, congestion, SoB, N/V/D,urinary symptoms Low grade fever yesterday Per RNs refusing meds  Objective: Vital signs in last 24 hours: Temp:  [98.2 F (36.8 C)-99.2 F (37.3 C)] 98.2 F (36.8 C) (06/25 2100) Pulse Rate:  [72-77] 72  (06/25 2100) Resp:  [18-20] 18  (06/25 2100) BP: (105-124)/(62-70) 124/70 mmHg (06/25 2100) SpO2:  [90 %-94 %] 90 % (06/25 2100) Weight change:  Last BM Date: 11/05/11  Intake/Output from previous day: 06/24 0701 - 06/25 0700 In: -  Out: 200 [Urine:200] Total I/O In: -  Out: 200 [Urine:200]   Physical Exam: General: Alert, awake, oriented x3, in no acute distress. HEENT: No bruits, no goiter. Heart: Regular rate and rhythm, without murmurs, rubs, gallops. Lungs: Clear to auscultation bilaterally. Abdomen: Soft, nontender, nondistended, positive bowel sounds. Extremities: No clubbing cyanosis or edema with positive pedal pulses. Neuro: Grossly intact, nonfocal.    Lab Results: Basic Metabolic Panel:  Basename 11/05/11 0515 11/04/11 0510  NA 136 141  K 3.7 3.6  CL 104 108  CO2 21 20  GLUCOSE 84 83  BUN 16 17  CREATININE 0.79 0.86  CALCIUM 8.6 8.5  MG -- --  PHOS -- --   Liver Function Tests:  Basename 11/05/11 0515 11/04/11 0510  AST 250* 166*  ALT 132* 75*  ALKPHOS 58 48  BILITOT 0.7 0.7  PROT 6.2 6.0  ALBUMIN 3.1* 3.0*   No results found for this basename: LIPASE:2,AMYLASE:2 in the last 72 hours No results found for this basename: AMMONIA:2 in the last 72 hours CBC:  Basename 11/05/11 0515 11/04/11 0510 11/03/11 0344  WBC 6.3 5.9 --  NEUTROABS 2.4 -- 3.9  HGB 11.3* 10.0* --  HCT 35.2* 31.1* --  MCV 102.3* 104.4* --  PLT 60* 70* --   Cardiac Enzymes: No results found for this basename: CKTOTAL:3,CKMB:3,CKMBINDEX:3,TROPONINI:3 in the last 72 hours BNP: No results found for this basename: PROBNP:3 in the last 72 hours D-Dimer: No results found for this basename: DDIMER:2 in the  last 72 hours CBG: No results found for this basename: GLUCAP:6 in the last 72 hours Hemoglobin A1C: No results found for this basename: HGBA1C in the last 72 hours Fasting Lipid Panel: No results found for this basename: CHOL,HDL,LDLCALC,TRIG,CHOLHDL,LDLDIRECT in the last 72 hours Thyroid Function Tests: No results found for this basename: TSH,T4TOTAL,FREET4,T3FREE,THYROIDAB in the last 72 hours Anemia Panel:  Basename 11/03/11 1130  VITAMINB12 743  FOLATE >20.0  FERRITIN 1877*  TIBC 267  IRON 17*  RETICCTPCT 2.0   Coagulation: No results found for this basename: LABPROT:2,INR:2 in the last 72 hours Urine Drug Screen: Drugs of Abuse  No results found for this basename: labopia,  cocainscrnur,  labbenz,  amphetmu,  thcu,  labbarb    Alcohol Level: No results found for this basename: ETH:2 in the last 72 hours Urinalysis:  Basename 11/03/11 1454 11/03/11 0349  COLORURINE AMBER* YELLOW  LABSPEC 1.019 1.021  PHURINE 5.5 6.0  GLUCOSEU NEGATIVE NEGATIVE  HGBUR LARGE* LARGE*  BILIRUBINUR NEGATIVE NEGATIVE  KETONESUR 15* 15*  PROTEINUR 30* 100*  UROBILINOGEN 1.0 1.0  NITRITE NEGATIVE NEGATIVE  LEUKOCYTESUR NEGATIVE NEGATIVE    Recent Results (from the past 240 hour(s))  URINE CULTURE     Status: Normal   Collection Time   11/03/11  3:49 AM      Component Value Range Status Comment   Specimen Description URINE, CLEAN CATCH   Final    Special Requests CX ADDED AT [id number]   Final  Culture  Setup Time [id number]   Final    Colony Count 4,000 COLONIES/ML   Final    Culture INSIGNIFICANT GROWTH   Final    Report Status 11/04/2011 FINAL   Final   CULTURE, BLOOD (ROUTINE X 2)     Status: Normal (Preliminary result)   Collection Time   11/03/11  5:00 PM      Component Value Range Status Comment   Specimen Description BLOOD RIGHT ARM   Final    Special Requests BOTTLES DRAWN AEROBIC AND ANAEROBIC   Final    Culture  Setup Time [id number]   Final    Culture     Final     Value:        BLOOD CULTURE RECEIVED NO GROWTH TO DATE CULTURE WILL BE HELD FOR 5 DAYS BEFORE ISSUING A FINAL NEGATIVE REPORT   Report Status PENDING   Incomplete   CULTURE, BLOOD (ROUTINE X 2)     Status: Normal (Preliminary result)   Collection Time   11/03/11  5:20 PM      Component Value Range Status Comment   Specimen Description BLOOD RIGHT HAND   Final    Special Requests BOTTLES DRAWN AEROBIC AND ANAEROBIC   Final    Culture  Setup Time [id number]   Final    Culture     Final    Value:        BLOOD CULTURE RECEIVED NO GROWTH TO DATE CULTURE WILL BE HELD FOR 5 DAYS BEFORE ISSUING A FINAL NEGATIVE REPORT   Report Status PENDING   Incomplete     Studies/Results: Ct Chest W Contrast  11/04/2011  *RADIOLOGY REPORT*  Clinical Data:  Fever of unknown origin.  CT CHEST, ABDOMEN AND PELVIS WITH CONTRAST  Technique:  Multidetector CT imaging of the chest, abdomen and pelvis was performed following the standard protocol during bolus administration of intravenous contrast.  Contrast:  100 ml Omnipaque-300  Comparison:   None.  CT CHEST  Findings:  There is no axillary, mediastinal, or hilar lymphadenopathy.  Heart size is at upper normal.  There is no pericardial or pleural effusion.  There is some subtle ill-defined soft tissue attenuation in the anterior mediastinal fat (see image 34 of series 2).  This is of indeterminate significance.  Atelectasis is seen in the posterior right lung base.  Left lung is clear.  Bone windows reveal no worrisome lytic or sclerotic osseous lesions.  IMPRESSION: No acute findings in the chest.  There is some subtle soft tissue attenuation in the anterior mediastinal fat it is indeterminate, but likely benign.  CT ABDOMEN AND PELVIS  Findings:  No focal abnormalities seen in the liver.  Spleen is unremarkable.  The stomach, duodenum, pancreas, gallbladder, and adrenal glands are unremarkable.  4 cm water density lesion in the right kidney is compatible with a  cyst.  There is some cortical scarring in the left kidney.  No abdominal aortic aneurysm.  There is no free fluid or lymphadenopathy in the abdomen.  Abdominal bowel loops are unremarkable.  Imaging through the pelvis shows no free intraperitoneal fluid.  No pelvic sidewall lymphadenopathy.  Left lower quadrant hernia contains a loop of sigmoid colon without complicating features. There is diverticular change diffusely in the left colon without diverticulitis.  Terminal ileum is unremarkable.  The appendix is normal  Bone windows reveal no worrisome lytic or sclerotic osseous lesions.  IMPRESSION:  No findings to explain the patient's history of fever.  Original  Report Authenticated By: ERIC A. MANSELL, M.D.   Ct Abdomen Pelvis W Contrast  11/04/2011  *RADIOLOGY REPORT*  Clinical Data:  Fever of unknown origin.  CT CHEST, ABDOMEN AND PELVIS WITH CONTRAST  Technique:  Multidetector CT imaging of the chest, abdomen and pelvis was performed following the standard protocol during bolus administration of intravenous contrast.  Contrast:  100 ml Omnipaque-300  Comparison:   None.  CT CHEST  Findings:  There is no axillary, mediastinal, or hilar lymphadenopathy.  Heart size is at upper normal.  There is no pericardial or pleural effusion.  There is some subtle ill-defined soft tissue attenuation in the anterior mediastinal fat (see image 34 of series 2).  This is of indeterminate significance.  Atelectasis is seen in the posterior right lung base.  Left lung is clear.  Bone windows reveal no worrisome lytic or sclerotic osseous lesions.  IMPRESSION: No acute findings in the chest.  There is some subtle soft tissue attenuation in the anterior mediastinal fat it is indeterminate, but likely benign.  CT ABDOMEN AND PELVIS  Findings:  No focal abnormalities seen in the liver.  Spleen is unremarkable.  The stomach, duodenum, pancreas, gallbladder, and adrenal glands are unremarkable.  4 cm water density lesion in the right  kidney is compatible with a cyst.  There is some cortical scarring in the left kidney.  No abdominal aortic aneurysm.  There is no free fluid or lymphadenopathy in the abdomen.  Abdominal bowel loops are unremarkable.  Imaging through the pelvis shows no free intraperitoneal fluid.  No pelvic sidewall lymphadenopathy.  Left lower quadrant hernia contains a loop of sigmoid colon without complicating features. There is diverticular change diffusely in the left colon without diverticulitis.  Terminal ileum is unremarkable.  The appendix is normal  Bone windows reveal no worrisome lytic or sclerotic osseous lesions.  IMPRESSION:  No findings to explain the patient's history of fever.  Original Report Authenticated By: ERIC A. MANSELL, M.D.    Medications: Scheduled Meds:    . aspirin EC  81 mg Oral Daily  . doxycycline  100 mg Oral Q12H  . sodium chloride  3 mL Intravenous Q12H  . DISCONTD: cefTRIAXone (ROCEPHIN)  IV  1 g Intravenous Q24H  . DISCONTD: doxycycline (VIBRAMYCIN) IV  100 mg Intravenous Q12H   Continuous Infusions:    . sodium chloride 50 mL/hr at 11/04/11 1014   PRN Meds:.acetaminophen, acetaminophen, iohexol, ondansetron (ZOFRAN) IV, ondansetron  Assessment/Plan:  1. FUO Urine /Blood cultures negative DC ceftriaxone CT Chest/Abd/Pelvis unremarkable LFTs mildly elevated, throbocytopenia? Viral illness/tick borne Started on Empiric doxycycline per ID, unfortunately refused dose last pm and today Appreciate ID consultation Compliance reinforced 2. Iron deficiency anemia Colonoscopy 2 years ago normal 3. Thrombocytopenia: acute, secondary to acute illness, platelet count was >200K in 4/13 (Called PCP's office today) 4. Falls secondary to fevers/generalized weakness PT following, home PT recommended 5. DVT prophylaxis: SCDs Home in 1-2days    LOS: 2 days   [facility] Pager: [phone] 11/05/2011, 10:41 PM

## 2011-11-05 NOTE — Evaluation (Signed)
Occupational Therapy Evaluation Patient Details Name: Jorge Ball MRN: [id number] DOB: February 09, 1927 Today's Date: 11/05/2011 Time: 9562-1308 OT Time Calculation (min): 25 min  OT Assessment / Plan / Recommendation Clinical Impression  78 yo male admitted for UTI that demonstrates high fall risk and unsafe demo of ADLS. OT to follow acutely. Recommend 24 / 7 (A) for d/c home Min (A)     OT Assessment  Patient needs continued OT Services    Follow Up Recommendations  Home health OT    Barriers to Discharge      Equipment Recommendations  3 in 1 bedside comode    Recommendations for Other Services    Frequency  Min 2X/week    Precautions / Restrictions Precautions Precautions: Fall Precaution Comments: Patient states recent fall have been trips or falls when trying to get out of his car.    Pertinent Vitals/Pain HIGH FALL RISK No pain    ADL  Upper Body Dressing: Performed;Supervision/safety (pt obtained clothes however unsafe standing to dress) Where Assessed - Upper Body Dressing: Unsupported standing Lower Body Dressing: Performed;Moderate assistance Where Assessed - Lower Body Dressing: Unsupported standing Toilet Transfer: Simulated;Minimal assistance Toilet Transfer Method: Sit to stand Toilet Transfer Equipment: Regular height toilet (required BIL UE use required difficulty with LOW surfaces) Transfers/Ambulation Related to ADLs: Pt abandoning RW in room and agitated insisting on locating wife. Rn and OT in room to help keep pt safe. Pt redirected to ADL task to help keep pt within room ADL Comments: pt completed full dressing ADL and insisted on standing. Pt states "its the socks"when demonstrating balance deficits and unsafe standing . pt not using RW at all for mobility. Pt holding onto one side of the RW standing outside the RW (RW not stable, pt unable to use both handles RW if needed due to positioning) Pt agreeable to sit to don shorts have x4 failed attempts to  don. pt states "what the heck is going on here I put these on every afternoon" Pt demonstrates high fall risk during adls. Pt with LOB x1 during session due to head turns with gathering items.    OT Diagnosis: Cognitive deficits  OT Problem List: Decreased strength;Decreased activity tolerance;Impaired balance (sitting and/or standing);Decreased safety awareness;Decreased knowledge of use of DME or AE;Decreased knowledge of precautions OT Treatment Interventions: Self-care/ADL training;DME and/or AE instruction;Therapeutic activities;Patient/family education;Balance training   OT Goals Acute Rehab OT Goals OT Goal Formulation: Patient unable to participate in goal setting Time For Goal Achievement: 11/19/11 Potential to Achieve Goals: Good ADL Goals Pt Will Perform Upper Body Bathing: with modified independence;Sit to stand from chair;Sit to stand from bed ADL Goal: Upper Body Bathing - Progress: Goal set today Pt Will Perform Lower Body Bathing: with modified independence;Sit to stand from chair;Sit to stand from bed ADL Goal: Lower Body Bathing - Progress: Goal set today Pt Will Perform Upper Body Dressing: with modified independence;Sit to stand from chair;Sit to stand from bed ADL Goal: Upper Body Dressing - Progress: Goal set today Pt Will Perform Lower Body Dressing: with modified independence;Sit to stand from chair;Sit to stand from bed ADL Goal: Lower Body Dressing - Progress: Goal set today Pt Will Transfer to Toilet: with modified independence;3-in-1 ADL Goal: Toilet Transfer - Progress: Goal set today Pt Will Perform Toileting - Clothing Manipulation: with modified independence;Sitting on 3-in-1 or toilet ADL Goal: Toileting - Clothing Manipulation - Progress: Goal set today Pt Will Perform Toileting - Hygiene: with modified independence;Sit to stand from 3-in-1/toilet ADL Goal:  Toileting - Hygiene - Progress: Goal set today Miscellaneous OT Goals Miscellaneous OT Goal #1: Pt  will complete Berg with score >50 to demonstrate decreased fall risk during ADLS OT Goal: Miscellaneous Goal #1 - Progress: Goal set today  Visit Information  Last OT Received On: 11/05/11 Assistance Needed: +1    Subjective Data  Subjective: "its these socks! I have to do it the way I do it"- pt agitated and insisting on standing with ADLS unsafe with balance deficits Patient Stated Goal: "i have to get this stuff ready for my wife"   Prior Functioning  Home Living Lives With: Spouse Available Help at Discharge: Family;Available 24 hours/day Type of Home: House Home Access: Stairs to enter [employer] of Steps: 3 Entrance Stairs-Rails: None Home Layout: One level Bathroom Shower/Tub: Walk-in shower;Door [employer] Toilet: Standard Bathroom Accessibility: Yes How Accessible: Accessible via walker Home Adaptive Equipment: Walker - rolling Prior Function Level of Independence: Independent Able to Take Stairs?: Yes Driving: Yes Vocation: Part time employment Comments: Paramedic Communication: HOH Dominant Hand: Right    Cognition  Overall Cognitive Status: Impaired Area of Impairment: Safety/judgement;Following commands;Memory;Problem solving;Awareness of deficits;Awareness of errors Arousal/Alertness: Awake/alert Behavior During Session: Agitated Memory Deficits: looking for wife and wondering out into hallway. Following Commands: Follows one step commands inconsistently;Follows one step commands with increased time Safety/Judgement: Decreased safety judgement for tasks assessed;Decreased awareness of safety precautions Safety/Judgement - Other Comments: LOB during session and states "I am fine" "i dont sit to do this " Awareness of Errors: Assistance required to identify errors made    Extremity/Trunk Assessment Right Upper Extremity Assessment RUE ROM/Strength/Tone: Within functional levels (grossly assesssed) RUE Coordination: WFL - gross  motor Left Upper Extremity Assessment LUE ROM/Strength/Tone: Within functional levels (grossly assessed) LUE Coordination: WFL - gross motor Trunk Assessment Trunk Assessment: Normal   Mobility Transfers Sit to Stand: 4: Min assist;With upper extremity assist;From bed Stand to Sit: 4: Min assist;With upper extremity assist;To bed (pulling up on RW) Details for Transfer Assistance: Pt required RW to pull self into standing and refusing to sit for ADLS due to inability to stand from bed without assistance. Pt provided option of sitting in chair with higher surface and arm rest to complete ADLS and pt refusing. pt unable to problem solve chair surface would be easier transfer.   Exercise    Balance Dynamic Standing Balance Dynamic Standing - Level of Assistance: 4: Min assist High Level Balance High Level Balance Activites:  (difficulty with single leg standing Rt LE> Lt LE)  End of Session OT - End of Session Activity Tolerance: Patient limited by fatigue Patient left: Other (comment) (RN tech in room and ambulating pt into hall with near fall) Nurse Communication: Mobility status;Precautions     Lucile Shutters 11/05/2011, 3:02 PM Pager: [phone]

## 2011-11-05 NOTE — Progress Notes (Signed)
Pt refusing IV fluids. Pt stating "all test are negative and I am ready to go home". Will notify Dr. Jomarie Longs and NSL patient. Ramond Craver, RN

## 2011-11-05 NOTE — Care Management Note (Signed)
    Page 1 of 2   11/06/2011     11:29:55 AM   CARE MANAGEMENT NOTE 11/06/2011  Patient:  Jorge Ball, Jorge Ball   Account Number:  [id number]  Date Initiated:  11/05/2011  Documentation initiated by:  GRAVES-BIGELOW,Marjoria Mancillas  Subjective/Objective Assessment:   Pt admitted with falls and cp. Pt is from home with wife. PT recommends HHPT with 24 hour supervision. CM will speak to both pt and wife.     Action/Plan:   CM did speak to [facility] with CIR and pt is not a candidate for CIR. CM will speak to wife and offer choice for [facility] services. CM will also provide pt with PCS list- this may be of some help due to wife still works.   Anticipated DC Date:  11/06/2011   Anticipated DC Plan:  HOME W HOME HEALTH SERVICES      DC Planning Services  CM consult      [facility] Choice  HOME HEALTH   Choice offered to / List presented to:  C-3 Spouse        HH arranged  HH-2 PT      [facility] agency  [facility]   Status of service:  Completed, signed off Medicare Important Message given?   (If response is "NO", the following Medicare IM given date fields will be blank) Date Medicare IM given:   Date Additional Medicare IM given:    Discharge Disposition:  HOME W HOME HEALTH SERVICES  Per UR Regulation:  Reviewed for med. necessity/level of care/duration of stay  If discussed at Long Length of Stay Meetings, dates discussed:    Comments:  11-06-11 [address] [place] [phone] CM made referral for [facility] services with [facility]. SOC to begin within 24-48 hours post d/c. No further needs for CM at this time.   11-05-11 [address], [place] [phone] CM did leave a list of HH agencies in pt's room in order for pt to choose. agency. MD please write order for HHPT for evaluation and treatment. CM will check back in am for agency choice. Will continue to f/u in am. Thanks

## 2011-11-05 NOTE — Progress Notes (Signed)
UR Completed Tache Bobst Graves-Bigelow, RN,BSN [phone]  

## 2011-11-05 NOTE — Progress Notes (Signed)
Pt refusing 0545 dose of Doxycycline IVPB states "I do not want to take anymore drugs" MD made aware. Will continue to monitor.

## 2011-11-06 DIAGNOSIS — R740 Nonspecific elevation of levels of transaminase and lactic acid dehydrogenase [LDH]: Secondary | ICD-10-CM

## 2011-11-06 DIAGNOSIS — M311 Thrombotic microangiopathy: Secondary | ICD-10-CM

## 2011-11-06 DIAGNOSIS — R509 Fever, unspecified: Secondary | ICD-10-CM

## 2011-11-06 DIAGNOSIS — R112 Nausea with vomiting, unspecified: Secondary | ICD-10-CM

## 2011-11-06 LAB — CBC WITH DIFFERENTIAL/PLATELET
Basophils Absolute: 0.2 10*3/uL — ABNORMAL HIGH (ref 0.0–0.1)
Eosinophils Absolute: 0 10*3/uL (ref 0.0–0.7)
HCT: 32 % — ABNORMAL LOW (ref 39.0–52.0)
Lymphocytes Relative: 34 % (ref 12–46)
MCHC: 32.2 g/dL (ref 30.0–36.0)
Monocytes Relative: 25 % — ABNORMAL HIGH (ref 3–12)
Neutro Abs: 3.3 10*3/uL (ref 1.7–7.7)
Neutrophils Relative %: 39 % — ABNORMAL LOW (ref 43–77)
Platelets: 54 10*3/uL — ABNORMAL LOW (ref 150–400)
RDW: 13.5 % (ref 11.5–15.5)
WBC: 8.5 10*3/uL (ref 4.0–10.5)

## 2011-11-06 LAB — UIFE/LIGHT CHAINS/TP QN, 24-HR UR
Albumin, U: DETECTED
Alpha 1, Urine: DETECTED — AB
Alpha 2, Urine: DETECTED — AB
Beta, Urine: DETECTED — AB
Gamma Globulin, Urine: DETECTED — AB

## 2011-11-06 LAB — PROTEIN ELECTROPHORESIS, SERUM
Alpha-1-Globulin: 6.7 % — ABNORMAL HIGH (ref 2.9–4.9)
Alpha-2-Globulin: 10.3 % (ref 7.1–11.8)
Beta 2: 7.6 % — ABNORMAL HIGH (ref 3.2–6.5)
Gamma Globulin: 17 % (ref 11.1–18.8)
M-Spike, %: NOT DETECTED g/dL

## 2011-11-06 LAB — ROCKY MTN SPOTTED FVR AB, IGM-BLOOD: RMSF IgM: 0.2 IV (ref 0.00–0.89)

## 2011-11-06 LAB — HEPATIC FUNCTION PANEL
AST: 216 U/L — ABNORMAL HIGH (ref 0–37)
Albumin: 3 g/dL — ABNORMAL LOW (ref 3.5–5.2)
Total Protein: 6.3 g/dL (ref 6.0–8.3)

## 2011-11-06 MED ORDER — DOXYCYCLINE HYCLATE 100 MG PO TABS: 100.0000 mg | ORAL_TABLET | Freq: Two times a day (BID) | ORAL | Status: AC

## 2011-11-06 NOTE — Progress Notes (Addendum)
INFECTIOUS DISEASE PROGRESS NOTE  ID: Jorge Ball is a 78 y.o. male with chronic anemia presents with fevers and  Possible episodes of syncope, found to have thrombocytopenia and elevated transaminitis  Subjective: Afebrile x 36hrs. Only has had 2 doses of doxy in last 36hr due to refusal. He wanted to check with his PCP if it is ok to take. He wants to go home today. Unclear why patient did not have lab draw, possibly refusal.   Abtx:  Doxy #2  Medications: reviewed  Objective: Vital signs in last 24 hours: Temp:  [97.8 F (36.6 C)-98.3 F (36.8 C)] 97.8 F (36.6 C) (06/26 0500) Pulse Rate:  [64-77] 64  (06/26 0500) Resp:  [18] 18  (06/26 0500) BP: (105-124)/(62-70) 112/69 mmHg (06/26 0500) SpO2:  [90 %-95 %] 95 % (06/26 0500)   Physical Exam  Constitutional: He is oriented to person, place, and time. He appears well-developed and well-nourished. No distress.  HENT:  Mouth/Throat: Oropharynx is clear and moist. No oropharyngeal exudate.  Cardiovascular: Normal rate, regular rhythm and normal heart sounds. Exam reveals no gallop and no friction rub.  No murmur heard.  Pulmonary/Chest: Effort normal and breath sounds normal. No respiratory distress. He has no wheezes.  Abdominal: Soft. Bowel sounds are normal. He exhibits no distension. There is no tenderness.  Lymphadenopathy:  He has no cervical adenopathy.  Neurological: He is alert and oriented to person, place, and time.  Skin: Skin is warm and dry. No rash noted. No erythema.  Psychiatric: He has a normal mood and affect. His behavior is normal.    Lab Results  [facility] 11/05/11 0515 11/04/11 0510  WBC 6.3 5.9  HGB 11.3* 10.0*  HCT 35.2* 31.1*  NA 136 141  K 3.7 3.6  CL 104 108  CO2 21 20  BUN 16 17  CREATININE 0.79 0.86  GLU -- --   Liver Panel  Basename 11/05/11 0515 11/04/11 0510  PROT 6.2 6.0  ALBUMIN 3.1* 3.0*  AST 250* 166*  ALT 132* 75*  ALKPHOS 58 48  BILITOT 0.7 0.7  BILIDIR -- --    IBILI -- --   Sedimentation Rate  Basename 11/04/11 1047  ESRSEDRATE 9   C-Reactive Protein  Basename 11/04/11 1047  CRP 12.66*    Microbiology: Recent Results (from the past 240 hour(s))  URINE CULTURE     Status: Normal   Collection Time   11/03/11  3:49 AM      Component Value Range Status Comment   Specimen Description URINE, CLEAN CATCH   Final    Special Requests CX ADDED AT 0657   Final    Culture  Setup Time [id number]   Final    Colony Count 4,000 COLONIES/ML   Final    Culture INSIGNIFICANT GROWTH   Final    Report Status 11/04/2011 FINAL   Final   CULTURE, BLOOD (ROUTINE X 2)     Status: Normal (Preliminary result)   Collection Time   11/03/11  5:00 PM      Component Value Range Status Comment   Specimen Description BLOOD RIGHT ARM   Final    Special Requests BOTTLES DRAWN AEROBIC AND ANAEROBIC   Final    Culture  Setup Time [id number]   Final    Culture     Final    Value:        BLOOD CULTURE RECEIVED NO GROWTH TO DATE CULTURE WILL BE HELD FOR 5 DAYS BEFORE ISSUING A FINAL NEGATIVE  REPORT   Report Status PENDING   Incomplete   CULTURE, BLOOD (ROUTINE X 2)     Status: Normal (Preliminary result)   Collection Time   11/03/11  5:20 PM      Component Value Range Status Comment   Specimen Description BLOOD RIGHT HAND   Final    Special Requests BOTTLES DRAWN AEROBIC AND ANAEROBIC   Final    Culture  Setup Time [id number]   Final    Culture     Final    Value:        BLOOD CULTURE RECEIVED NO GROWTH TO DATE CULTURE WILL BE HELD FOR 5 DAYS BEFORE ISSUING A FINAL NEGATIVE REPORT   Report Status PENDING   Incomplete     Studies/Results: Ct Chest W Contrast  11/04/2011  *RADIOLOGY REPORT*  Clinical Data:  Fever of unknown origin.  CT CHEST, ABDOMEN AND PELVIS WITH CONTRAST  Technique:  Multidetector CT imaging of the chest, abdomen and pelvis was performed following the standard protocol during bolus administration of intravenous contrast.  Contrast:   100 ml Omnipaque-300  Comparison:   None.  CT CHEST  Findings:  There is no axillary, mediastinal, or hilar lymphadenopathy.  Heart size is at upper normal.  There is no pericardial or pleural effusion.  There is some subtle ill-defined soft tissue attenuation in the anterior mediastinal fat (see image 34 of series 2).  This is of indeterminate significance.  Atelectasis is seen in the posterior right lung base.  Left lung is clear.  Bone windows reveal no worrisome lytic or sclerotic osseous lesions.  IMPRESSION: No acute findings in the chest.  There is some subtle soft tissue attenuation in the anterior mediastinal fat it is indeterminate, but likely benign.  CT ABDOMEN AND PELVIS  Findings:  No focal abnormalities seen in the liver.  Spleen is unremarkable.  The stomach, duodenum, pancreas, gallbladder, and adrenal glands are unremarkable.  4 cm water density lesion in the right kidney is compatible with a cyst.  There is some cortical scarring in the left kidney.  No abdominal aortic aneurysm.  There is no free fluid or lymphadenopathy in the abdomen.  Abdominal bowel loops are unremarkable.  Imaging through the pelvis shows no free intraperitoneal fluid.  No pelvic sidewall lymphadenopathy.  Left lower quadrant hernia contains a loop of sigmoid colon without complicating features. There is diverticular change diffusely in the left colon without diverticulitis.  Terminal ileum is unremarkable.  The appendix is normal  Bone windows reveal no worrisome lytic or sclerotic osseous lesions.  IMPRESSION:  No findings to explain the patient's history of fever.  Original Report Authenticated By: ERIC A. MANSELL, M.D.   Ct Abdomen Pelvis W Contrast  11/04/2011  *RADIOLOGY REPORT*  Clinical Data:  Fever of unknown origin.  CT CHEST, ABDOMEN AND PELVIS WITH CONTRAST  Technique:  Multidetector CT imaging of the chest, abdomen and pelvis was performed following the standard protocol during bolus administration of  intravenous contrast.  Contrast:  100 ml Omnipaque-300  Comparison:   None.  CT CHEST  Findings:  There is no axillary, mediastinal, or hilar lymphadenopathy.  Heart size is at upper normal.  There is no pericardial or pleural effusion.  There is some subtle ill-defined soft tissue attenuation in the anterior mediastinal fat (see image 34 of series 2).  This is of indeterminate significance.  Atelectasis is seen in the posterior right lung base.  Left lung is clear.  Bone windows reveal no worrisome  lytic or sclerotic osseous lesions.  IMPRESSION: No acute findings in the chest.  There is some subtle soft tissue attenuation in the anterior mediastinal fat it is indeterminate, but likely benign.  CT ABDOMEN AND PELVIS  Findings:  No focal abnormalities seen in the liver.  Spleen is unremarkable.  The stomach, duodenum, pancreas, gallbladder, and adrenal glands are unremarkable.  4 cm water density lesion in the right kidney is compatible with a cyst.  There is some cortical scarring in the left kidney.  No abdominal aortic aneurysm.  There is no free fluid or lymphadenopathy in the abdomen.  Abdominal bowel loops are unremarkable.  Imaging through the pelvis shows no free intraperitoneal fluid.  No pelvic sidewall lymphadenopathy.  Left lower quadrant hernia contains a loop of sigmoid colon without complicating features. There is diverticular change diffusely in the left colon without diverticulitis.  Terminal ileum is unremarkable.  The appendix is normal  Bone windows reveal no worrisome lytic or sclerotic osseous lesions.  IMPRESSION:  No findings to explain the patient's history of fever.  Original Report Authenticated By: ERIC A. MANSELL, M.D.     Assessment/Plan: 78yo Male with fever, thrombocytopenia, and transaminitis which are worsening, now plt 60 and alt/ast are 132/250. Undergoing FUO work-up. We are treating empirically with doxy for presumed tick borne illness. RMSF serology pending  1) concern  for tick borne illness give lft and plt abnormality and severity of illness if goes untreated, continue to treat with doxy for a total of  7 days. Thus, only needs 6 more days  2) would repeat cbc and cmp to ensure plt and transaminases are improving.  3) FUO work-up - free kappa light chains are elevated in UPEP. Await results of SPEP as well to see if patient has abn suggestive of  Multiple myeloma or MGUS which may also contribute to fevers.  4) will need to forward results to PCP so that can help with follow up with patient at end of the week.  Will sign off  Friend Dorfman Infectious Diseases 11/06/2011, 9:58 AM

## 2011-11-06 NOTE — Discharge Instructions (Signed)
Home Health Services arranged with [facility]. [phone]. 1: Physical Therapy  

## 2011-11-06 NOTE — Progress Notes (Signed)
Pt provided with d/c instructions and education. Pt also provided with same instrutions and education. Pt and family both aware of diangis and follow up appointment with PCP. No questions at this time. HHPT/Ot has been set up and family aware. Prescriptions sent to pharmacy. No questions at this time. Pt leaving floor in wheelchair with family. Ramond Craver, RN

## 2011-11-06 NOTE — Discharge Summary (Signed)
Discharge Summary  Jorge Ball MR#: [id number]  DOB:1926-10-13  Date of Admission: 11/03/2011 Date of Discharge: 11/06/2011  Patient's PCP: Enrique Sack, MD  Attending Physician:Arlow Spiers  Consults: 1.  Infectious disease: Dr. Judyann Munson  Discharge Diagnoses: Active Problems:  UTI (urinary tract infection)  Falls FUO/Possible tickborne illness Thrombocytopenia Transaminitis  Brief Admitting History and Physical Jorge Ball is a 78 y.o. male with chronic anemia presented with fevers and possible episodes of syncope, found to have thrombocytopenia and elevated transaminitis.   Discharge Medications Current Discharge Medication List    START taking these medications   Details  doxycycline (VIBRA-TABS) 100 MG tablet Take 1 tablet (100 mg total) by mouth every 12 (twelve) hours. Qty: 14 tablet, Refills: 0      CONTINUE these medications which have NOT CHANGED   Details  aspirin EC 81 MG tablet Take 81 mg by mouth daily.        Hospital Course: <principal problem not specified> Present on Admission:  .UTI (urinary tract infection) .Falls   1. FUO/possible tickborne illness: Admitted to hospital. Urine culture: Insignificant growth. Blood cultures x2: Negative. Patient was empirically started on IV Rocephin. Infectious disease was consulted. Chest abdomen and pelvis CT does not suggest any worrisome findings for his presentation. Infectious disease recommended starting empiric doxycycline 100 mg by mouth twice a day. RMSF antibody titers are pending. Clinically patient has improved. No fevers for more than 48 hours. Mental status apparently has returned to baseline according to his daughter. He still has mild transaminitis and his platelet counts are low. Patient however has been insisting on going home and refusing care. Discussed with Dr. Drue Second and patient will be discharged home to complete one week of doxycycline which he has agreed to take. He will  followup with his primary care physician on 11/08/11. Discussed with patient's primary care physician Dr. Nila Nephew. 2. Thrombocytopenia: Possibly secondary to problem #1. No bleeding. Follow up with PCP on 6/28 with repeat CBCs. 3. Mild transaminitis: Possibly secondary to problem #1. Followup with repeat LFTs on 6/28. 4. Iron deficiency anemia: Mild drop in hemoglobin. Outpatient followup and evaluation as deemed necessary. 5. History of falls: Possibly secondary to weakness associated with febrile illness. PT and OT recommends 24-hour supervision and home health services. Patient indicates that he does not see a need for 24-hour supervision. This was discussed at length with patient's daughter in the room.    Day of Discharge  Complaints: Denies complaints and insists on going home. Denies pain or headache or fevers. According to patient's daughter who is at the bedside-mental status is back to normal this morning. According to nursing, patient has been refusing medications and insists on going home.  Physical exam:  BP 112/69  Pulse 64  Temp 97.8 F (36.6 C) (Oral)  Resp 18  Ht 5\' 9"  (1.753 m)  Wt 94.9 kg (209 lb 3.5 oz)  BMI 30.90 kg/m2  SpO2 95%  general exam: Comfortable. Respiratory system: Clear. No increased work of breathing. Cardiovascular system: First and second heart sounds heard, regular. No JVD or murmurs or pedal edema. Telemetry shows sinus rhythm. Gastrointestinal system: Abdomen is nondistended, soft and normal bowel sounds heard. Nontender. Central nervous system: Alert and oriented. No focal neurological deficits. Extremities: Symmetric 5 x 5 power. Neck: Supple.   Basic Metabolic Panel:  Lab 11/05/11 4098 11/04/11 0510 11/03/11 0344  NA 136 141 139  K 3.7 3.6 3.2*  CL 104 108 104  CO2 21 20 23  GLUCOSE 84 83 119*  BUN 16 17 14   CREATININE 0.79 0.86 0.95  CALCIUM 8.6 8.5 8.6  ALB -- -- --  PHOS -- -- --   Liver Function Tests:  Lab 11/06/11 0945  11/05/11 0515 11/04/11 0510  AST 216* 250* 166*  ALT 146* 132* 75*  ALKPHOS 64 58 48  BILITOT 0.7 0.7 0.7  PROT 6.3 6.2 6.0  ALBUMIN 3.0* 3.1* 3.0*   No results found for this basename: LIPASE:3,AMYLASE:3 in the last 168 hours No results found for this basename: AMMONIA:3 in the last 168 hours CBC:  Lab 11/06/11 0945 11/05/11 0515 11/04/11 0510 11/03/11 0344  WBC 8.5 6.3 5.9 --  NEUTROABS 3.3 2.4 -- 3.9  HGB 10.3* 11.3* 10.0* --  HCT 32.0* 35.2* 31.1* --  MCV 100.3* 102.3* 104.4* 103.9*  PLT 54* 60* 70* --   Cardiac Enzymes: No results found for this basename: CKTOTAL:5,CKMB:5,CKMBINDEX:5,TROPONINI:5 in the last 168 hours CBG: No results found for this basename: GLUCAP:5 in the last 168 hours  other lab data:  1. Anemia panel: Iron 17, total iron binding capacity 267, ferritin 1877, folate greater than 20, vitamin B 12: 743, reticulocyte count 64 2. CRP: 12.66. 3. Venous lactate 0.8. 4. ESR: 9. 5. ANA: Negative. 6. Urine analysis: Not indicated of urinary tract infection. 7. UPEP: Free kappa light chains: 42 and free lambda light chains 15.10. Incomplete results. 8. Blood cultures x2: Negative to date. 9. Urine cultures: Insignificant growth. 10. RMSF IgG and IgM: Pending   Dg Chest 2 View  11/03/2011  *RADIOLOGY REPORT*  Clinical Data: Multiple falls; altered mental status.  CHEST - 2 VIEW  Comparison: Chest radiograph performed 06/17/2011  Findings: The lungs are well-aerated.  Mild vascular congestion is noted, without significant pulmonary edema.  There is no evidence of focal opacification, pleural effusion or pneumothorax.  The heart is normal in size; calcification is noted within the aortic arch.  No acute osseous abnormalities are seen.  IMPRESSION: Mild vascular congestion, without significant pulmonary edema.  No displaced rib fractures seen.  Original Report Authenticated By: Tonia Ghent, M.D.   Ct Head Wo Contrast  11/03/2011  *RADIOLOGY REPORT*  Clinical  Data: Status post fall; altered mental status.  Concern for head injury.  CT HEAD WITHOUT CONTRAST  Technique:  Contiguous axial images were obtained from the base of the skull through the vertex without contrast.  Comparison: None.  Findings: There is no evidence of acute infarction, mass lesion, or intra- or extra-axial hemorrhage on CT.  Prominence of the ventricles and sulci reflects moderate cortical volume loss.  Cerebellar atrophy is noted.  Mild periventricular white matter change likely reflects small vessel ischemic microangiopathy.  The brainstem and fourth ventricle are within normal limits.  The basal ganglia are unremarkable in appearance.  The cerebral hemispheres demonstrate grossly normal gray-white differentiation. No mass effect or midline shift is seen.  There is no evidence of fracture; visualized osseous structures are unremarkable in appearance.  Bilateral proptosis is noted.  The paranasal sinuses and mastoid air cells are well-aerated.  No significant soft tissue abnormalities are seen.  Cerumen is noted within the right external auditory canal.  IMPRESSION:  1.  No evidence of traumatic intracranial injury or fracture. 2.  Moderate cortical volume loss and scattered small vessel ischemic microangiopathy. 3.  Bilateral proptosis noted, of uncertain significance. 4.  Cerumen noted within the right external auditory canal.  Original Report Authenticated By: Tonia Ghent, M.D.   Ct Chest W Contrast  11/04/2011  *RADIOLOGY REPORT*  Clinical Data:  Fever of unknown origin.  CT CHEST, ABDOMEN AND PELVIS WITH CONTRAST  Technique:  Multidetector CT imaging of the chest, abdomen and pelvis was performed following the standard protocol during bolus administration of intravenous contrast.  Contrast:  100 ml Omnipaque-300  Comparison:   None.  CT CHEST  Findings:  There is no axillary, mediastinal, or hilar lymphadenopathy.  Heart size is at upper normal.  There is no pericardial or pleural  effusion.  There is some subtle ill-defined soft tissue attenuation in the anterior mediastinal fat (see image 34 of series 2).  This is of indeterminate significance.  Atelectasis is seen in the posterior right lung base.  Left lung is clear.  Bone windows reveal no worrisome lytic or sclerotic osseous lesions.  IMPRESSION: No acute findings in the chest.  There is some subtle soft tissue attenuation in the anterior mediastinal fat it is indeterminate, but likely benign.  CT ABDOMEN AND PELVIS  Findings:  No focal abnormalities seen in the liver.  Spleen is unremarkable.  The stomach, duodenum, pancreas, gallbladder, and adrenal glands are unremarkable.  4 cm water density lesion in the right kidney is compatible with a cyst.  There is some cortical scarring in the left kidney.  No abdominal aortic aneurysm.  There is no free fluid or lymphadenopathy in the abdomen.  Abdominal bowel loops are unremarkable.  Imaging through the pelvis shows no free intraperitoneal fluid.  No pelvic sidewall lymphadenopathy.  Left lower quadrant hernia contains a loop of sigmoid colon without complicating features. There is diverticular change diffusely in the left colon without diverticulitis.  Terminal ileum is unremarkable.  The appendix is normal  Bone windows reveal no worrisome lytic or sclerotic osseous lesions.  IMPRESSION:  No findings to explain the patient's history of fever.  Original Report Authenticated By: ERIC A. MANSELL, M.D.      Disposition:  Discharged home in stable condition.  Diet:  heart healthy.  Activity:  increase activity gradually.   Follow-up Appts: Discharge Orders    Future Orders Please Complete By Expires   Diet - low sodium heart healthy      Increase activity slowly      Call MD for:  temperature >100.4      Call MD for:  persistant dizziness or light-headedness      Call MD for:  extreme fatigue      Call MD for:  difficulty breathing, headache or visual disturbances          TESTS THAT NEED FOLLOW-UP 1. CBC and LFTs on 11/08/11.  2. RMSF IgM and IgG results. 3. Final blood culture results 4. Final UPEP and SPEP results   Time spent on discharge, talking to the patient, and coordinating care:  greater than 30 minutes.   SignedMarcellus Scott, MD 11/06/2011, 12:24 PM

## 2011-11-10 LAB — CULTURE, BLOOD (ROUTINE X 2): Culture: NO GROWTH

## 2012-05-18 ENCOUNTER — Other Ambulatory Visit: Payer: Self-pay | Admitting: Internal Medicine

## 2012-05-18 ENCOUNTER — Ambulatory Visit
Admission: RE | Admit: 2012-05-18 | Discharge: 2012-05-18 | Disposition: A | Discharge status: Home / Self Care | Payer: Medicare Other | Source: Ambulatory Visit | Attending: Internal Medicine | Admitting: Internal Medicine

## 2012-05-18 DIAGNOSIS — R222 Localized swelling, mass and lump, trunk: Secondary | ICD-10-CM

## 2012-05-28 ENCOUNTER — Other Ambulatory Visit: Payer: Self-pay | Admitting: Internal Medicine

## 2012-05-28 DIAGNOSIS — R600 Localized edema: Secondary | ICD-10-CM

## 2012-05-28 DIAGNOSIS — R319 Hematuria, unspecified: Secondary | ICD-10-CM

## 2012-06-01 ENCOUNTER — Ambulatory Visit
Admission: RE | Admit: 2012-06-01 | Discharge: 2012-06-01 | Disposition: A | Discharge status: Home / Self Care | Payer: Medicare Other | Source: Ambulatory Visit | Attending: Internal Medicine | Admitting: Internal Medicine

## 2012-06-01 DIAGNOSIS — R600 Localized edema: Secondary | ICD-10-CM

## 2012-06-01 DIAGNOSIS — R319 Hematuria, unspecified: Secondary | ICD-10-CM

## 2012-06-01 MED ORDER — IOHEXOL 300 MG/ML  SOLN
125.0000 mL | Freq: Once | INTRAMUSCULAR | Status: AC | PRN
  Administered 2012-06-01: 125 mL via INTRAVENOUS

## 2012-06-02 ENCOUNTER — Encounter (HOSPITAL_COMMUNITY): Payer: Medicare Other

## 2012-06-02 ENCOUNTER — Ambulatory Visit (HOSPITAL_COMMUNITY)
Admission: RE | Admit: 2012-06-02 | Discharge: 2012-06-02 | Disposition: A | Discharge status: Home / Self Care | Payer: Medicare Other | Source: Ambulatory Visit | Attending: Internal Medicine | Admitting: Internal Medicine

## 2012-06-02 DIAGNOSIS — R609 Edema, unspecified: Secondary | ICD-10-CM | POA: Insufficient documentation

## 2012-06-02 DIAGNOSIS — R600 Localized edema: Secondary | ICD-10-CM

## 2012-06-02 NOTE — Progress Notes (Addendum)
*  PRELIMINARY RESULTS* Vascular Ultrasound Lower extremity venous duplex has been completed.  Preliminary findings: Bilateral:  No obvious evidence of DVT.  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  Called report to Danielle at Dr. Thomasene Lot office.  Farrel Demark, RDMS, RVT 06/02/2012, 11:55 AM

## 2013-02-17 ENCOUNTER — Other Ambulatory Visit: Payer: Self-pay | Admitting: Internal Medicine

## 2013-02-17 ENCOUNTER — Ambulatory Visit
Admission: RE | Admit: 2013-02-17 | Discharge: 2013-02-17 | Disposition: A | Discharge status: Home / Self Care | Payer: Medicare Other | Source: Ambulatory Visit | Attending: Internal Medicine | Admitting: Internal Medicine

## 2013-02-17 DIAGNOSIS — M549 Dorsalgia, unspecified: Secondary | ICD-10-CM

## 2013-08-03 ENCOUNTER — Encounter (HOSPITAL_COMMUNITY): Payer: Self-pay | Admitting: Emergency Medicine

## 2013-08-03 ENCOUNTER — Inpatient Hospital Stay: Admit: 2013-08-03 | Payer: Self-pay | Admitting: Internal Medicine

## 2013-08-03 ENCOUNTER — Emergency Department (HOSPITAL_COMMUNITY): Payer: Medicare Other

## 2013-08-03 ENCOUNTER — Inpatient Hospital Stay (HOSPITAL_COMMUNITY)
Admission: EM | Admit: 2013-08-03 | Discharge: 2013-08-06 | DRG: 835 | Disposition: A | Discharge status: Hospice | Payer: Medicare Other | Attending: Internal Medicine | Admitting: Internal Medicine

## 2013-08-03 DIAGNOSIS — W19XXXA Unspecified fall, initial encounter: Secondary | ICD-10-CM

## 2013-08-03 DIAGNOSIS — D649 Anemia, unspecified: Secondary | ICD-10-CM | POA: Diagnosis present

## 2013-08-03 DIAGNOSIS — C9200 Acute myeloblastic leukemia, not having achieved remission: Principal | ICD-10-CM | POA: Diagnosis present

## 2013-08-03 DIAGNOSIS — R296 Repeated falls: Secondary | ICD-10-CM | POA: Diagnosis present

## 2013-08-03 DIAGNOSIS — Z66 Do not resuscitate: Secondary | ICD-10-CM | POA: Diagnosis present

## 2013-08-03 DIAGNOSIS — R634 Abnormal weight loss: Secondary | ICD-10-CM | POA: Diagnosis present

## 2013-08-03 DIAGNOSIS — N179 Acute kidney failure, unspecified: Secondary | ICD-10-CM | POA: Diagnosis present

## 2013-08-03 DIAGNOSIS — Z6825 Body mass index (BMI) 25.0-25.9, adult: Secondary | ICD-10-CM

## 2013-08-03 DIAGNOSIS — R748 Abnormal levels of other serum enzymes: Secondary | ICD-10-CM

## 2013-08-03 DIAGNOSIS — Z515 Encounter for palliative care: Secondary | ICD-10-CM

## 2013-08-03 DIAGNOSIS — F1010 Alcohol abuse, uncomplicated: Secondary | ICD-10-CM | POA: Diagnosis present

## 2013-08-03 DIAGNOSIS — Z87891 Personal history of nicotine dependence: Secondary | ICD-10-CM

## 2013-08-03 DIAGNOSIS — D630 Anemia in neoplastic disease: Secondary | ICD-10-CM | POA: Diagnosis present

## 2013-08-03 DIAGNOSIS — R509 Fever, unspecified: Secondary | ICD-10-CM

## 2013-08-03 DIAGNOSIS — M353 Polymyalgia rheumatica: Secondary | ICD-10-CM | POA: Diagnosis present

## 2013-08-03 DIAGNOSIS — C9500 Acute leukemia of unspecified cell type not having achieved remission: Secondary | ICD-10-CM

## 2013-08-03 DIAGNOSIS — D72829 Elevated white blood cell count, unspecified: Secondary | ICD-10-CM | POA: Diagnosis present

## 2013-08-03 DIAGNOSIS — R627 Adult failure to thrive: Secondary | ICD-10-CM | POA: Diagnosis present

## 2013-08-03 LAB — BASIC METABOLIC PANEL
BUN: 25 mg/dL — AB (ref 6–23)
CHLORIDE: 104 meq/L (ref 96–112)
CO2: 19 meq/L (ref 19–32)
Calcium: 8.9 mg/dL (ref 8.4–10.5)
Creatinine, Ser: 1.7 mg/dL — ABNORMAL HIGH (ref 0.50–1.35)
GFR calc non Af Amer: 35 mL/min — ABNORMAL LOW (ref >90)
GFR, EST AFRICAN AMERICAN: 41 mL/min — AB (ref >90)
Glucose, Bld: 102 mg/dL — ABNORMAL HIGH (ref 70–99)
POTASSIUM: 4.7 meq/L (ref 3.7–5.3)
Sodium: 140 mEq/L (ref 137–147)

## 2013-08-03 LAB — CBC WITH DIFFERENTIAL/PLATELET
Band Neutrophils: 11 % — ABNORMAL HIGH (ref 0–10)
Basophils Absolute: 0 10*3/uL (ref 0.0–0.1)
Basophils Relative: 0 % (ref 0–1)
Eosinophils Absolute: 0 10*3/uL (ref 0.0–0.7)
Eosinophils Relative: 0 % (ref 0–5)
HCT: 12.1 % — ABNORMAL LOW (ref 39.0–52.0)
Hemoglobin: 4 g/dL — CL (ref 13.0–17.0)
LYMPHS ABS: 12.3 10*3/uL — AB (ref 0.7–4.0)
Lymphocytes Relative: 18 % (ref 12–46)
MCH: 34.8 pg — AB (ref 26.0–34.0)
MCHC: 33.1 g/dL (ref 30.0–36.0)
MCV: 105.2 fL — ABNORMAL HIGH (ref 78.0–100.0)
METAMYELOCYTES PCT: 5 %
MONO ABS: 1.4 10*3/uL — AB (ref 0.1–1.0)
MYELOCYTES: 19 %
Monocytes Relative: 2 % — ABNORMAL LOW (ref 3–12)
Neutro Abs: 54.7 10*3/uL — ABNORMAL HIGH (ref 1.7–7.7)
Neutrophils Relative %: 45 % (ref 43–77)
PLATELETS: 216 10*3/uL (ref 150–400)
RBC: 1.15 MIL/uL — ABNORMAL LOW (ref 4.22–5.81)
RDW: 17.8 % — AB (ref 11.5–15.5)
WBC: 68.4 10*3/uL (ref 4.0–10.5)

## 2013-08-03 LAB — PREPARE RBC (CROSSMATCH)

## 2013-08-03 LAB — CBC
HEMATOCRIT: 12.4 % — AB (ref 39.0–52.0)
HEMOGLOBIN: 4.2 g/dL — AB (ref 13.0–17.0)
MCH: 35.6 pg — ABNORMAL HIGH (ref 26.0–34.0)
MCHC: 33.9 g/dL (ref 30.0–36.0)
MCV: 105.1 fL — AB (ref 78.0–100.0)
Platelets: 207 10*3/uL (ref 150–400)
RBC: 1.18 MIL/uL — ABNORMAL LOW (ref 4.22–5.81)
RDW: 17.9 % — ABNORMAL HIGH (ref 11.5–15.5)
WBC: 68.5 10*3/uL (ref 4.0–10.5)

## 2013-08-03 LAB — IRON AND TIBC: Iron: 265 ug/dL — ABNORMAL HIGH (ref 42–135)

## 2013-08-03 LAB — RETICULOCYTES
RBC.: 1.17 MIL/uL — ABNORMAL LOW (ref 4.22–5.81)
RETIC COUNT ABSOLUTE: 5.9 10*3/uL — AB (ref 19.0–186.0)
Retic Ct Pct: 0.5 % (ref 0.4–3.1)

## 2013-08-03 LAB — PRO B NATRIURETIC PEPTIDE: Pro B Natriuretic peptide (BNP): 1238 pg/mL — ABNORMAL HIGH (ref 0–450)

## 2013-08-03 LAB — POC OCCULT BLOOD, ED: Fecal Occult Bld: NEGATIVE

## 2013-08-03 LAB — ABO/RH: ABO/RH(D): A NEG

## 2013-08-03 LAB — I-STAT TROPONIN, ED: TROPONIN I, POC: 0.02 ng/mL (ref 0.00–0.08)

## 2013-08-03 LAB — LACTATE DEHYDROGENASE: LDH: 794 U/L — ABNORMAL HIGH (ref 94–250)

## 2013-08-03 LAB — PATHOLOGIST SMEAR REVIEW

## 2013-08-03 MED ORDER — OXYCODONE HCL 5 MG PO TABS: 5.0000 mg | ORAL_TABLET | ORAL | Status: DC | PRN

## 2013-08-03 MED ORDER — ONDANSETRON HCL 4 MG/2ML IJ SOLN: 4.0000 mg | Freq: Four times a day (QID) | INTRAMUSCULAR | Status: DC | PRN

## 2013-08-03 MED ORDER — PREDNISONE 10 MG PO TABS
10.0000 mg | ORAL_TABLET | Freq: Every day | ORAL | Status: DC
  Administered 2013-08-03 – 2013-08-06 (×4): 10 mg via ORAL
  Filled 2013-08-03 (×4): qty 1

## 2013-08-03 MED ORDER — ONDANSETRON HCL 4 MG PO TABS: 4.0000 mg | ORAL_TABLET | Freq: Four times a day (QID) | ORAL | Status: DC | PRN

## 2013-08-03 MED ORDER — ACETAMINOPHEN 325 MG PO TABS: 650.0000 mg | ORAL_TABLET | Freq: Four times a day (QID) | ORAL | Status: DC | PRN

## 2013-08-03 MED ORDER — ACETAMINOPHEN 650 MG RE SUPP: 650.0000 mg | Freq: Four times a day (QID) | RECTAL | Status: DC | PRN

## 2013-08-03 MED ORDER — SODIUM CHLORIDE 0.9 % IJ SOLN
3.0000 mL | Freq: Two times a day (BID) | INTRAMUSCULAR | Status: DC
  Administered 2013-08-04 – 2013-08-05 (×3): 3 mL via INTRAVENOUS

## 2013-08-03 MED ORDER — SODIUM CHLORIDE 0.9 % IV SOLN: INTRAVENOUS | Status: DC

## 2013-08-03 NOTE — ED Notes (Signed)
Dr. Coralyn Pear from internal medicine at bedside.

## 2013-08-03 NOTE — H&P (Signed)
[facility] History and Physical  Roma Bierlein HYW:[id number] DOB: [birth year] DOA: 08/03/2013  Referring physician:  PCP: Criselda Peaches, MD   Chief Complaint: Generalized weakness  HPI: F Bradin Mcadory is a 80 y.o. male with a past medical history of fever of unknown origin who was admitted back in 2013 for workup of fever of unknown origin. It appears that during that hospitalization he was found to be thrombocytopenic  having a platelet count of 54,000 on 11/06/2011. Patient having low-grade fevers intermittently since then and was diagnosed with polymyalgia rheumatica in the outpatient setting and was started on steroid therapy. Family members reporting patient having a gradual functional decline since October of 2014, with a steep functional decline over the past week. Patient becoming increasingly weak, lethargic, unintentional weight loss, now requiring significant assistance with ambulation, minimal by mouth intake, poor tolerance to physical exertion. He went to his primary care physician's office today where he was found to have a hemoglobin of 4 with white blood cell count of 70,000. Patient was referred to the emergency room where initial lab work showed a hemoglobin of 4.2 with white blood cell count of 16,500. Labs also revealed the presence of acute kidney injury  having creatinine of 1.7 with BUN of 25. He had a normal kidney function in July of 2013. Of note his platelet count was 207,000. He denies chest pain or shortness of breath. He also denies like black tarry stools, bright red blood per rectum, hematemesis. Patient found to be guaiac-negative in the emergency room.                                                                                                                                                Review of Systems:  Constitutional:  No weight loss, night sweats, positive for fevers, chills, fatigue.  HEENT:  No headaches, Difficulty swallowing,Tooth/dental  problems,Sore throat,  No sneezing, itching, ear ache, nasal congestion, post nasal drip,  Cardio-vascular:  No chest pain, Orthopnea, PND, positive for swelling in lower extremities, anasarca, dizziness, palpitations  GI:   No heartburn, indigestion, abdominal pain, nausea, vomiting, diarrhea, change in bowel habits, loss of appetite  Resp:  No shortness of breath with exertion or at rest. No excess mucus, no productive cough, No non-productive cough, No coughing up of blood.No change in color of mucus.No wheezing.No chest wall deformity  Skin:  no rash or lesions.  GU:  no dysuria, change in color of urine, no urgency or frequency. No flank pain.  Musculoskeletal:  No joint pain or swelling. No decreased range of motion. No back pain.  Psych:  No change in mood or affect. No depression or anxiety. No memory loss.   History reviewed. No pertinent past medical history. Past Surgical History  Procedure Laterality Date  . Vasectomy     Social History:  reports that  he has quit smoking. He has never used smokeless tobacco. He reports that he drinks about 0.6 ounces of alcohol per week. He reports that he does not use illicit drugs.  No Known Allergies  History reviewed. No pertinent family history.   Prior to Admission medications   Medication Sig Start Date End Date Taking? Authorizing Provider  Multiple Vitamins-Minerals (MULTIVITAMIN WITH MINERALS) tablet Take 1 tablet by mouth daily.   Yes Historical Provider, MD  predniSONE (DELTASONE) 10 MG tablet Take 10 mg by mouth daily.   Yes Historical Provider, MD  vitamin C (ASCORBIC ACID) 250 MG tablet Take 250 mg by mouth daily.   Yes Historical Provider, MD   Physical Exam: Filed Vitals:   08/03/13 1419  BP: 111/43  Pulse: 57  Temp: 98.5 F (36.9 C)  Resp: 22    BP 111/43  Pulse 57  Temp(Src) 98.5 F (36.9 C) (Oral)  Resp 22  Wt 83.915 kg (185 lb)  SpO2 100%  General:  Ill-appearing, significant pallor, does not appear  to be in acute distress. Appears calm and comfortable Eyes: PERRL, normal lids, irises, with pale conjunctiva ENT: grossly normal hearing, lips & tongue Neck: no LAD, masses or thyromegaly Cardiovascular: RRR, no m/r/g. No LE edema. Telemetry: SR, no arrhythmias  Respiratory: CTA bilaterally, no w/r/r. Normal respiratory effort. Abdomen: soft, ntnd Skin: no rash or induration seen on limited exam Musculoskeletal: +2 bilateral lower extremity pitting edema Psychiatric: grossly normal mood and affect, speech fluent and appropriate Neurologic: grossly non-focal.          Labs on Admission:  Basic Metabolic Panel:  Recent Labs Lab 08/03/13 1050  NA 140  K 4.7  CL 104  CO2 19  GLUCOSE 102*  BUN 25*  CREATININE 1.70*  CALCIUM 8.9   Liver Function Tests: No results found for this basename: AST, ALT, ALKPHOS, BILITOT, PROT, ALBUMIN,  in the last 168 hours No results found for this basename: LIPASE, AMYLASE,  in the last 168 hours No results found for this basename: AMMONIA,  in the last 168 hours CBC:  Recent Labs Lab 08/03/13 1050  WBC 68.5*  HGB 4.2*  HCT 12.4*  MCV 105.1*  PLT 207   Cardiac Enzymes: No results found for this basename: CKTOTAL, CKMB, CKMBINDEX, TROPONINI,  in the last 168 hours  BNP (last 3 results) No results found for this basename: PROBNP,  in the last 8760 hours CBG: No results found for this basename: GLUCAP,  in the last 168 hours  Radiological Exams on Admission: Dg Chest Port 1 View  08/03/2013   CLINICAL DATA:  Fatigue.  EXAM: PORTABLE CHEST - 1 VIEW  COMPARISON:  05/18/2012  FINDINGS: Cardiac silhouette is normal in size and configuration. Aorta is mildly uncoiled. No mediastinal or hilar masses. Clear lungs. No pleural effusion or pneumothorax.  Bony thorax is grossly intact.  IMPRESSION: No active disease.   Electronically Signed   By: Lajean Manes M.D.   On: 08/03/2013 11:30    EKG: Independently reviewed.   Assessment/Plan Active  Problems:   Anemia   Leukocytosis   Acute kidney failure   FTT (failure to thrive) in adult   1. Profound anemia. Patient presenting with a hemoglobin of 4.2. Looking back at previous records he had a hemoglobin between 10 and 11 back in 2013. With his history of steroid use, GI bleed as a possibility however he was guaiac-negative in the emergency room, furthermore denies bloody stools or melena. His white count at  16,500 raises the concern for underlying malignancy. Currently pending is a differential. I discussed case with Dr.Chism of medical oncology. Will check an LDH, reticulocyte count, haptoglobin, iron studies. Patient was typed and crossed as a transfusion was initiated in the emergency room. I will ordered 2 additional units when he reaches the floor for a total of 4 units to be transfused. 2. Leukocytosis. Lab work showing a white count of 68,500. Seems unlikely that this would be steroid related. It appears that he was admitted back in 2013 for workup of fever of unknown origin however at that time it appears that his white count was within normal range. Differential that revealed atypical lymphocytes. CBC with differential has been sent, awaiting results. I discussed case with medical oncology as there is concern for the possibility of underlying malignancy.  3. Acute kidney injury. Lab work showing a creatinine of 1.7 with BUN of 25. I suspect secondary to prerenal azotemia and hypovolemia. Will provide IV fluid resuscitation, transfuse packed red blood cells, repeat lab work in a.m. 4. Failure to thrive. Patient having gradual decline since October with a steep functional decline over the past week. He presents profoundly anemic with an elevated white count. Will workup for possible underlying malignancy, review differential on CBC. Provide IV fluid resuscitation. 5. DVT prophylaxis. Bilateral extremity SCDs     Code Status: CODE STATUS discussed with patient and family, he is a DO NOT  RESUSCITATE  Family Communication: I spoke with patient's wife and daughter at bedside Disposition Plan: Will admit patient to the inpatient service as I anticipate he'll require greater than 2 night hospitalization   Time spent: 70 min  Kelvin Cellar [facility] Pager [phone]

## 2013-08-03 NOTE — ED Notes (Signed)
Pt presents to ED due to his PCP instructed him to come to the ED for further evaluation of a low Hgb. Pt states he had a tractor accident in September and was doing a follow up appt yesterday with his PCP and due to his presenting symptoms, they collected blood work. Pt reports SOB and weakness that increases with activity but subsides with rest. Pt's spouse also reports pt has fallen twice in the last few weeks, pt states "my legs just gave out when I got out of the chair." Pt denies any recent bloody or dark colored stool but admits to some rectal bleeding after his accident in September. Pt denies any nausea, vomiting, diarrhea, chest pain, or abd pain. Pt does admit chronic back pain to his lower back since his accident in September

## 2013-08-03 NOTE — Consult Note (Signed)
[place] [phone]   [phone]  INPATIENT CONSULT NOTE  REFERRING PHYSICIAN: Kelvin Cellar, MD  REASON FOR CONSULTATION:  Leukocytosis, profound anemia  HPI Jorge Ball is a 80 y.o. male with a history of polymyalgia rheumatica on prednisone who was referred to [facility] ER secondary to leukocytosis and profound anemia by his PCP (Dr. Nyoka Cowden).  Patient reported progressive fatigue over the past several months. .  His hemoglobin was 4.4 with a WBC of 70k at his PCP's office. IN the ER, his WBC was 68.5; Hemoglobin was 4.1 with an MCV of 105.1; Plts of 207k. Creatinine was 1.7.   He was guaiac negative.The patient lives with his wife and daughter.  He denies chest pain or acute shortness of breath.  He does report increasing weakness resulting in maximum assist with ambulation.  He also has decreased appetite.  Patient's family was not present as we will likely require additional history regarding his functional status. He denies melena or hematochezia.  We do not have recent complete blood counts to compare these lab values.   HPI  History reviewed. No pertinent past medical history.  Past Surgical History  Procedure Laterality Date  . Vasectomy      History reviewed. No pertinent family history.  Social History History  Substance Use Topics  . Smoking status: Former Research scientist (life sciences)  . Smokeless tobacco: Never Used  . Alcohol Use: 0.6 oz/week    1 Shots of liquor per week    No Known Allergies  Current Facility-Administered Medications  Medication Dose Route Frequency Provider Last Rate Last Dose  . 0.9 %  sodium chloride infusion   Intravenous Continuous Kelvin Cellar, MD      . acetaminophen (TYLENOL) tablet 650 mg  650 mg Oral Q6H PRN Kelvin Cellar, MD       Or  . acetaminophen (TYLENOL) suppository 650 mg  650 mg Rectal Q6H PRN Kelvin Cellar, MD      . ondansetron (ZOFRAN) tablet 4 mg  4 mg Oral Q6H PRN Kelvin Cellar, MD        Or  . ondansetron (ZOFRAN) injection 4 mg  4 mg Intravenous Q6H PRN Kelvin Cellar, MD      . oxyCODONE (Oxy IR/ROXICODONE) immediate release tablet 5 mg  5 mg Oral Q4H PRN Kelvin Cellar, MD      . predniSONE (DELTASONE) tablet 10 mg  10 mg Oral Daily Kelvin Cellar, MD   10 mg at 08/03/13 1819  . sodium chloride 0.9 % injection 3 mL  3 mL Intravenous Q12H Kelvin Cellar, MD        Review of Systems  A comprehensive review of systems was negative except for: Constitutional: positive for anorexia, fatigue, fevers, malaise and weight loss and as indicated in HPI.   Physical Exam  RAL:no distress, comfortable and pale SKIN: no rashes or significant lesions HEAD: Normocephalic EYES: EOMI, sclera clear EARS: External ears normal OROPHARYNX:no exudate and no erythema  NECK: supple, no adenopathy, thyroid normal size, non-tender, without nodularity LUNGS: clear to auscultation  HEART: regular rate & rhythm, no murmurs and no gallops ABDOMEN:abdomen soft, non-tender, obese and normal bowel sounds EXTREMITIES:2+ edema  NEURO: alert & oriented x 3 with fluent speech, no focal motor/sensory deficits  PERFORMANCE STATUS: ECOG 3  LABORATORY DATA: Lab Results  Component Value Date   WBC 68.5* 08/03/2013   WBC 68.4* 08/03/2013   HGB 4.2* 08/03/2013   HGB 4.0* 08/03/2013   HCT 12.4* 08/03/2013  HCT 12.1* 08/03/2013   MCV 105.1* 08/03/2013   MCV 105.2* 08/03/2013   PLT 207 08/03/2013   PLT 216 08/03/2013   CMP     Component Value Date/Time   NA 140 08/03/2013 1050   K 4.7 08/03/2013 1050   CL 104 08/03/2013 1050   CO2 19 08/03/2013 1050   GLUCOSE 102* 08/03/2013 1050   BUN 25* 08/03/2013 1050   CREATININE 1.70* 08/03/2013 1050   CALCIUM 8.9 08/03/2013 1050   PROT 6.3 11/06/2011 0945   ALBUMIN 3.0* 11/06/2011 0945   AST 216* 11/06/2011 0945   ALT 146* 11/06/2011 0945   ALKPHOS 64 11/06/2011 0945   BILITOT 0.7 11/06/2011 0945   GFRNONAA 35* 08/03/2013 1050   GFRAA 41* 08/03/2013 1050    Results for KARAM, DUNSON (MRN [id number]) as of 08/03/2013 23:42  Ref. Range 08/03/2013 10:50  Path Review No range found Acute leukemia.    RADIOGRAPHIC STUDIES: Dg Chest Port 1 View  08/03/2013   CLINICAL DATA:  Fatigue.  EXAM: PORTABLE CHEST - 1 VIEW  COMPARISON:  05/18/2012  FINDINGS: Cardiac silhouette is normal in size and configuration. Aorta is mildly uncoiled. No mediastinal or hilar masses. Clear lungs. No pleural effusion or pneumothorax.  Bony thorax is grossly intact.  IMPRESSION: No active disease.   Electronically Signed   By: Lajean Manes M.D.   On: 08/03/2013 11:30   ASSESSMENT:  1. 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 Profound anemia secondary #1. 3. DNR 4. History of Polymyalgia rheumatica 5. History of FUO 6. AKI likely secondary #2 7. Elevated LFTs  RECOMMENDATIONS:  1. Please send peripheral blood for flow cytometry and cytogenetics.   2. Patient is not a candidate for aggressive induction chemotherapy given age and co-morbidities 3. Continue supportive care.  He appears comfortable presently. Agree with transfusing 4 units of packed RBCs.  4. We will discuss with patient and family options regarding his leukemia, i.e., Hydrea versus Vidaza based on workup above.  Patient requested his wife and daughter be present for discussion  The patient voices understanding of current disease status and treatment options including supportive care and is in agreement with the current care plan.  All questions were answered. The patient knows to call the clinic with any problems, questions or concerns. We can certainly see the patient much sooner if necessary.  Thank you so much for allowing me to participate in the care of AMES HOBAN. I will continue to follow up the patient with you and assist in his care.  I spent 40 minutes counseling the patient face to face. The total time spent in the appointment was 60 minutes.  Plan  discussed with cross-covering hospitalist.   Analea Muller 08/03/2013, 8:05 PM

## 2013-08-03 NOTE — ED Notes (Signed)
Pt arrives via POV sent from doctors office for evaluation of low hemoglobin and weakness. Pt awake, alert, pale, oriented x4, resp labored

## 2013-08-03 NOTE — ED Provider Notes (Addendum)
CSN: [id number]     Arrival date & time 08/03/13  1013 History   First MD Initiated Contact with Patient 08/03/13 1037     Chief Complaint  Patient presents with  . Fatigue      HPI  Patient presents after being referred from his primary care physician's office. PCP is Dr. Nyoka Cowden.  I spoke with Dr. Nyoka Cowden about this patient. Within the last 2 years the patient was being evaluated for a fever of unknown origin and anemia. Ultimately a diagnosis of polymyalgia rheumatica was given. He had been symptomatic for several months. He is placed on prednisone. He recovered his hemoglobin and  Also resolved his other symptoms within 3 weeks of therapy.  The patient describes several months of easy fatigue it resolved with rest. He denies black or bloody or dark stools. He had blood drawn a Dr. Rolly Salter office yesterday. He was called and told that his hemoglobin was 4. Per Dr. Nyoka Cowden the patient's CRP was 2.5, his heart rate was greater than 140, his white blood cell count was 70,000, his hemoglobin was 4.4.   History reviewed. No pertinent past medical history. Past Surgical History  Procedure Laterality Date  . Vasectomy     History reviewed. No pertinent family history. History  Substance Use Topics  . Smoking status: Former Research scientist (life sciences)  . Smokeless tobacco: Never Used  . Alcohol Use: 0.6 oz/week    1 Shots of liquor per week    Review of Systems  Constitutional: Positive for fatigue. Negative for fever, chills, diaphoresis and appetite change.  HENT: Negative for mouth sores, sore throat and trouble swallowing.   Eyes: Negative for visual disturbance.  Respiratory: Positive for shortness of breath. Negative for cough, chest tightness and wheezing.        With exertion. Quickly resolves.  Cardiovascular: Negative for chest pain.       Denies chest pain  Gastrointestinal: Negative for nausea, vomiting, abdominal pain, diarrhea and abdominal distention.  Endocrine: Negative for polydipsia,  polyphagia and polyuria.  Genitourinary: Negative for dysuria, frequency and hematuria.  Musculoskeletal: Negative for gait problem.       Complains of chronic back pain. Not exacerbated now. No neurological symptoms.  Skin: Negative for color change, pallor and rash.  Neurological: Negative for dizziness, syncope, light-headedness and headaches.  Hematological: Does not bruise/bleed easily.  Psychiatric/Behavioral: Negative for behavioral problems and confusion.      Allergies  Review of patient's allergies indicates no known allergies.  Home Medications   Current Outpatient Rx  Name  Route  Sig  Dispense  Refill  . Multiple Vitamins-Minerals (MULTIVITAMIN WITH MINERALS) tablet   Oral   Take 1 tablet by mouth daily.         . predniSONE (DELTASONE) 10 MG tablet   Oral   Take 10 mg by mouth daily.         . vitamin C (ASCORBIC ACID) 250 MG tablet   Oral   Take 250 mg by mouth daily.          BP 101/39  Pulse 124  Temp(Src) 98.6 F (37 C)  Resp 21  Wt 185 lb (83.915 kg)  SpO2 90% Physical Exam  Constitutional: He is oriented to person, place, and time. He appears well-developed and well-nourished. No distress.  He appears pale. He is pale his lips, conjunctiva, and nailbeds.  HENT:  Head: Normocephalic.  Eyes: Conjunctivae are normal. Pupils are equal, round, and reactive to light. No scleral icterus.  Neck:  Normal range of motion. Neck supple. No thyromegaly present.  Cardiovascular: Normal rate and regular rhythm.  Exam reveals no gallop and no friction rub.   No murmur heard. Is not tachycardic at rest  Pulmonary/Chest: Effort normal and breath sounds normal. No respiratory distress. He has no wheezes. He has no rales.  Abdominal: Soft. Bowel sounds are normal. He exhibits no distension. There is no tenderness. There is no rebound.  No pain or organomegaly. Light brown stool on rectal exam.  Musculoskeletal: Normal range of motion.  Neurological: He is  alert and oriented to person, place, and time.  Skin: Skin is warm and dry. No rash noted.  Psychiatric: He has a normal mood and affect. His behavior is normal.    ED Course  CRITICAL CARE Performed by: Cheyann Blecha, [place] by: Tanna Furry Total critical care time: 35 minutes Critical care time was exclusive of separately billable procedures and treating other patients. Critical care was necessary to treat or prevent imminent or life-threatening deterioration of the following conditions: symptomatic anemia requiring transfusion. Critical care was time spent personally by me on the following activities: blood draw for specimens, development of treatment plan with patient or surrogate, discussions with consultants, discussions with primary provider, interpretation of cardiac output measurements, evaluation of patient's response to treatment, examination of patient, obtaining history from patient or surrogate, ordering and review of laboratory studies and re-evaluation of patient's condition.   (including critical care time) Labs Review Labs Reviewed  CBC - Abnormal; Notable for the following:    WBC 68.5 (*)    RBC 1.18 (*)    Hemoglobin 4.2 (*)    HCT 12.4 (*)    MCV 105.1 (*)    MCH 35.6 (*)    RDW 17.9 (*)    All other components within normal limits  BASIC METABOLIC PANEL - Abnormal; Notable for the following:    Glucose, Bld 102 (*)    BUN 25 (*)    Creatinine, Ser 1.70 (*)    GFR calc non Af Amer 35 (*)    GFR calc Af Amer 41 (*)    All other components within normal limits  PRO B NATRIURETIC PEPTIDE  I-STAT TROPOININ, ED  POC OCCULT BLOOD, ED  TYPE AND SCREEN  PREPARE RBC (CROSSMATCH)  ABO/RH   Imaging Review Dg Chest Port 1 View  08/03/2013   CLINICAL DATA:  Fatigue.  EXAM: PORTABLE CHEST - 1 VIEW  COMPARISON:  05/18/2012  FINDINGS: Cardiac silhouette is normal in size and configuration. Aorta is mildly uncoiled. No mediastinal or hilar masses. Clear lungs. No  pleural effusion or pneumothorax.  Bony thorax is grossly intact.  IMPRESSION: No active disease.   Electronically Signed   By: Lajean Manes M.D.   On: 08/03/2013 11:30     EKG Interpretation None      MDM   Final diagnoses:  Anemia  Leucocytosis    Labs have been sent to repeat confirms IUs. With leukocytosis and 70,000, hemoglobin for an acute leukemia is a in the differential. Rule out GI bleed with the chronic prednisone use.  13:00:  Guaiac negative. Marked leukocytosis with white count of 68,000. Discussed case with Dr. Coralyn Pear. Also discussed case with Dr. Nyoka Cowden the patient's primary care physician. Patient will be admitted for further studies. Initiate transfusion emergency room.    Tanna Furry, MD 08/03/13 [place], MD 08/03/13 [phone]

## 2013-08-03 NOTE — ED Notes (Signed)
Pt admitted to 2W room 14, pt is keeping all belongings with him, IV site unremarkable, report given to [employer]

## 2013-08-03 NOTE — ED Notes (Signed)
Portable xr at bedside 

## 2013-08-03 NOTE — ED Notes (Signed)
Dr. Jeneen Rinks has requested we hold the start of the blood transfusion until he speaks with the hospitalist about pt's current condition.

## 2013-08-03 NOTE — Progress Notes (Signed)
Unit CM UR Completed by [facility] ED CM  W. Lilee Aldea RN  

## 2013-08-03 NOTE — ED Notes (Signed)
MD James at bedside.  

## 2013-08-04 DIAGNOSIS — C9200 Acute myeloblastic leukemia, not having achieved remission: Principal | ICD-10-CM

## 2013-08-04 LAB — CBC WITH DIFFERENTIAL/PLATELET
BAND NEUTROPHILS: 0 % (ref 0–10)
BLASTS: 0 %
Basophils Absolute: 0 10*3/uL (ref 0.0–0.1)
Basophils Relative: 0 % (ref 0–1)
Eosinophils Absolute: 0 10*3/uL (ref 0.0–0.7)
Eosinophils Relative: 0 % (ref 0–5)
HEMATOCRIT: 21.9 % — AB (ref 39.0–52.0)
Hemoglobin: 7.5 g/dL — ABNORMAL LOW (ref 13.0–17.0)
Lymphocytes Relative: 57 % — ABNORMAL HIGH (ref 12–46)
Lymphs Abs: 31.3 10*3/uL — ABNORMAL HIGH (ref 0.7–4.0)
MCH: 32.5 pg (ref 26.0–34.0)
MCHC: 34.2 g/dL (ref 30.0–36.0)
MCV: 94.8 fL (ref 78.0–100.0)
METAMYELOCYTES PCT: 4 %
MONO ABS: 0 10*3/uL — AB (ref 0.1–1.0)
MYELOCYTES: 27 %
Monocytes Relative: 0 % — ABNORMAL LOW (ref 3–12)
Neutro Abs: 23.6 10*3/uL — ABNORMAL HIGH (ref 1.7–7.7)
Neutrophils Relative %: 12 % — ABNORMAL LOW (ref 43–77)
PLATELETS: ADEQUATE 10*3/uL (ref 150–400)
PROMYELOCYTES ABS: 0 %
RBC: 2.31 MIL/uL — ABNORMAL LOW (ref 4.22–5.81)
RDW: 20.4 % — ABNORMAL HIGH (ref 11.5–15.5)
WBC: 54.9 10*3/uL (ref 4.0–10.5)
nRBC: 0 /100 WBC

## 2013-08-04 LAB — PATHOLOGIST SMEAR REVIEW

## 2013-08-04 LAB — COMPREHENSIVE METABOLIC PANEL
ALT: 10 U/L (ref 0–53)
AST: 38 U/L — AB (ref 0–37)
Albumin: 2.9 g/dL — ABNORMAL LOW (ref 3.5–5.2)
Alkaline Phosphatase: 56 U/L (ref 39–117)
BILIRUBIN TOTAL: 0.5 mg/dL (ref 0.3–1.2)
BUN: 23 mg/dL (ref 6–23)
CHLORIDE: 106 meq/L (ref 96–112)
CO2: 21 meq/L (ref 19–32)
Calcium: 8.5 mg/dL (ref 8.4–10.5)
Creatinine, Ser: 1.43 mg/dL — ABNORMAL HIGH (ref 0.50–1.35)
GFR calc Af Amer: 50 mL/min — ABNORMAL LOW (ref >90)
GFR, EST NON AFRICAN AMERICAN: 43 mL/min — AB (ref >90)
GLUCOSE: 81 mg/dL (ref 70–99)
Potassium: 4.5 mEq/L (ref 3.7–5.3)
SODIUM: 142 meq/L (ref 137–147)
Total Protein: 6 g/dL (ref 6.0–8.3)

## 2013-08-04 LAB — PROTIME-INR
INR: 1.29 (ref 0.00–1.49)
Prothrombin Time: 15.8 seconds — ABNORMAL HIGH (ref 11.6–15.2)

## 2013-08-04 NOTE — Progress Notes (Signed)
PROGRESS NOTE  Jorge Ball OJJ:[id number] DOB: [birth year] DOA: 08/03/2013 PCP: Criselda Peaches, MD  Assessment/Plan: Acute leukemia - Oncology to discuss today with patient and family re: treatment options. Appreciate input.  Symptomatic anemia - s/p 4U pRBC, Hb improved. Monitor in am. Clinically better and wants to go home soon.  Leukocytosis - due to #1 AKI - improved with fluids.   Diet: regular Fluids: NS DVT Prophylaxis: SCDs  Code Status: DNR Family Communication: daughter and wife bedside  Disposition Plan: inpatient, home when ready   Consultants:  Oncology  Procedures:  none   Antibiotics - none  HPI/Subjective: - feeling better, does not know why he is here, and wants to go home.   Objective: Filed Vitals:   08/03/13 2145 08/03/13 2245 08/03/13 2314 08/04/13 0405  BP: 125/57 126/58 126/57 113/61  Pulse: 70 64 66 67  Temp: 99.2 F (37.3 C) 98.9 F (37.2 C) 99.4 F (37.4 C) 99.4 F (37.4 C)  TempSrc: Oral Oral Oral Oral  Resp: 20 19 20 20   Height:      Weight:      SpO2: 98% 97% 97% 95%    Intake/Output Summary (Last 24 hours) at 08/04/13 1354 Last data filed at 08/04/13 0700  Gross per 24 hour  Intake   1475 ml  Output    450 ml  Net   1025 ml   Filed Weights   08/03/13 1029 08/03/13 1733  Weight: 83.915 kg (185 lb) 83.9 kg (184 lb 15.5 oz)   Exam:  General:  NAD  Cardiovascular: regular rate and rhythm, without MRG  Respiratory: good air movement, clear to auscultation throughout, no wheezing, ronchi or rales  Abdomen: soft, not tender to palpation, positive bowel sounds  MSK: no peripheral edema  Neuro: CN 2-12 grossly intact, MS 5/5 in all 4  Data Reviewed: Basic Metabolic Panel:  Recent Labs Lab 08/03/13 1050 08/04/13 0352  NA 140 142  K 4.7 4.5  CL 104 106  CO2 19 21  GLUCOSE 102* 81  BUN 25* 23  CREATININE 1.70* 1.43*  CALCIUM 8.9 8.5   Liver Function Tests:  Recent Labs Lab 08/04/13 0352  AST 38*    ALT 10  ALKPHOS 56  BILITOT 0.5  PROT 6.0  ALBUMIN 2.9*   CBC:  Recent Labs Lab 08/03/13 1050 08/04/13 0955  WBC 68.5*  68.4* 54.9*  NEUTROABS 54.7* 23.6*  HGB 4.2*  4.0* 7.5*  HCT 12.4*  12.1* 21.9*  MCV 105.1*  105.2* 94.8  PLT 207  216 PLATELET CLUMPS NOTED ON SMEAR, COUNT APPEARS ADEQUATE   BNP (last 3 results)  Recent Labs  08/03/13 1104  PROBNP 1238.0*   Studies: Dg Chest Port 1 View  08/03/2013   CLINICAL DATA:  Fatigue.  EXAM: PORTABLE CHEST - 1 VIEW  COMPARISON:  05/18/2012  FINDINGS: Cardiac silhouette is normal in size and configuration. Aorta is mildly uncoiled. No mediastinal or hilar masses. Clear lungs. No pleural effusion or pneumothorax.  Bony thorax is grossly intact.  IMPRESSION: No active disease.   Electronically Signed   By: Lajean Manes M.D.   On: 08/03/2013 11:30    Scheduled Meds: . predniSONE  10 mg Oral Daily  . sodium chloride  3 mL Intravenous Q12H   Continuous Infusions: . sodium chloride Stopped (08/03/13 1607)    Active Problems:   Anemia   Leukocytosis   Acute kidney failure   FTT (failure to thrive) in adult   Time spent: 26  This note has been created with Surveyor, quantity. Any transcriptional errors are unintentional.   Marzetta Board, MD [facility] Pager [phone]. If 7 PM - 7 AM, please contact night-coverage at [email or web address], password [facility] 08/04/2013, 1:54 PM  LOS: 1 day

## 2013-08-04 NOTE — Progress Notes (Signed)
UR Completed.  Jenina Moening Jane [phone] 08/04/2013  

## 2013-08-04 NOTE — Evaluation (Signed)
Physical Therapy Evaluation Patient Details Name: Jorge Ball MRN: [id number] DOB: 08/19/1926 Today's Date: 08/04/2013   History of Present Illness  Jorge Ball is a 80 y.o. male with a history of polymyalgia rheumatica on prednisone who was referred to [facility] ER secondary to leukocytosis and profound anemia by his PCP (Dr. Nyoka Cowden).  Patient reported progressive fatigue over the past several months. .  His hemoglobin was 4.4 with a WBC of 70k at his PCP's office.   Clinical Impression  Pt adm due to the above. Presents with limitations in functional mobility secondary to deficits indicated below and possible self limiting attitude. Pt required max encouragement to participate with therapy. Pt inconsistent with following cues from therapy for safety. Pt to benefit from skilled acute PT to maximize mobility prior to D/C home with wife. Encouraged pt to ambulate at all times with RW for safety.   Follow Up Recommendations Home health PT;Supervision/Assistance - 24 hour;Other (comment) (however, pt states he will refuse HHPT )    Equipment Recommendations  Rolling walker with 5" wheels    Recommendations for Other Services       Precautions / Restrictions Precautions Precautions: Fall Precaution Comments: 2 recent falls  Restrictions Weight Bearing Restrictions: No      Mobility  Bed Mobility Overal bed mobility: Modified Independent             General bed mobility comments: effortful for pt; pt relies on handrails   Transfers Overall transfer level: Needs assistance Equipment used: None Transfers: Sit to/from Stand Sit to Stand: Supervision         General transfer comment: supervision for cues and safety; no LOB noted   Ambulation/Gait Ambulation/Gait assistance: Min guard Ambulation Distance (Feet): 16 Feet Assistive device: None Gait Pattern/deviations: Step-through pattern;Wide base of support;Trunk flexed;Decreased stride length Gait velocity:  decreased Gait velocity interpretation: Below normal speed for age/gender General Gait Details: pt refusing to ambulate in hallway at this time; limited to in room ambulation; pt reaching for bil UE support during ambulation to steady; pt becomes very defensive when giving recommendations or cues for safety; will need to ambulate with RW upon D/C for safety   Stairs            Wheelchair Mobility    Modified Rankin (Stroke Patients Only)       Balance Overall balance assessment: History of Falls;Needs assistance Sitting-balance support: Feet supported;No upper extremity supported Sitting balance-Leahy Scale: Good     Standing balance support: During functional activity;No upper extremity supported Standing balance-Leahy Scale: Fair Standing balance comment: no LOB noted but pt with sway intially and reaching for UE support at times                      Pertinent Vitals/Pain No complaints.    Home Living Family/patient expects to be discharged to:: Private residence Living Arrangements: Spouse/significant other Available Help at Discharge: Family;Available 24 hours/day Type of Home: House Home Access: Stairs to enter Entrance Stairs-Rails: Right Entrance Stairs-Number of Steps: 3 Home Layout: One level Home Equipment: Walker - 2 wheels      Prior Function Level of Independence: Independent with assistive device(s)         Comments: pt reports he primarily ambulates with RW in community      Hand Dominance   Dominant Hand: Right    Extremity/Trunk Assessment   Upper Extremity Assessment: Overall WFL for tasks assessed  Lower Extremity Assessment: Overall WFL for tasks assessed      Cervical / Trunk Assessment: Other exceptions  Communication   Communication: HOH  Cognition Arousal/Alertness: Awake/alert Behavior During Therapy: WFL for tasks assessed/performed;Agitated Overall Cognitive Status: No family/caregiver present to  determine baseline cognitive functioning Area of Impairment: Problem solving             Problem Solving: Slow processing;Difficulty sequencing;Requires verbal cues;Requires tactile cues General Comments: pt asked multiple times what the objects on his table are; no family present    General Comments General comments (skin integrity, edema, etc.): pt very defensive regarding working with PT; refusing to ambulate in hallway at this time; pt also seemed confused at times and asked multiple times what the stuff on his table was; bil LEs edematous- pt reports this is baseline      Exercises General Exercises - Lower Extremity Ankle Circles/Pumps: AROM;Both;10 reps;Supine      Assessment/Plan    PT Assessment Patient needs continued PT services  PT Diagnosis Difficulty walking   PT Problem List Decreased activity tolerance;Decreased balance;Decreased cognition;Decreased safety awareness  PT Treatment Interventions DME instruction;Gait training;Stair training;Functional mobility training;Therapeutic activities;Therapeutic exercise;Balance training;Neuromuscular re-education;Patient/family education   PT Goals (Current goals can be found in the Care Plan section) Acute Rehab PT Goals Patient Stated Goal: to go home today PT Goal Formulation: With patient Time For Goal Achievement: 08/18/13 Potential to Achieve Goals: Good    Frequency Min 3X/week   Barriers to discharge Decreased caregiver support pt reports his wife is not always home with him     End of Session Equipment Utilized During Treatment: Gait belt Activity Tolerance: Patient limited by fatigue Patient left: in chair;with call bell/phone within reach         Time: 2353-6144 PT Time Calculation (min): 18 min   Charges:   PT Evaluation $Initial PT Evaluation Tier I: 1 Procedure PT Treatments $Gait Training: 8-22 mins   PT G CodesGustavus Bryant, [place] [phone] 08/04/2013, 12:57 PM

## 2013-08-04 NOTE — Progress Notes (Signed)
Jorge Ball   DOB:09-25-1926   NI#:[id number]   IRW#:[id number]  Subjective: Patient anxious to go home.  Multiple family members are at the bedside. They are concerned about his safety at home.  No acute overnight events noted.   Objective:  Filed Vitals:   08/04/13 1520  BP: 117/56  Pulse: 84  Temp: 98.1 F (36.7 C)  Resp: 18    Body mass index is 25.81 kg/(m^2).  Intake/Output Summary (Last 24 hours) at 08/04/13 1806 Last data filed at 08/04/13 1523  Gross per 24 hour  Intake 1127.5 ml  Output    751 ml  Net  376.5 ml    PE: Deferred.   CBG (last 3)  No results found for this basename: GLUCAP,  in the last 72 hours   Labs:  Lab Results  Component Value Date   WBC 54.9* 08/04/2013   HGB 7.5* 08/04/2013   HCT 21.9* 08/04/2013   MCV 94.8 08/04/2013   PLT PLATELET CLUMPS NOTED ON SMEAR, COUNT APPEARS ADEQUATE 08/04/2013   NEUTROABS 23.6* 08/04/2013    @LASTCHEMISTRY @  Urine Studies No results found for this basename: UACOL, UAPR, USPG, UPH, UTP, UGL, UKET, UBIL, UHGB, UNIT, UROB, ULEU, UEPI, UWBC, URBC, UBAC, CAST, CRYS, UCOM, BILUA,  in the last 72 hours  Basic Metabolic Panel:  Recent Labs Lab 08/03/13 1050 08/04/13 0352  NA 140 142  K 4.7 4.5  CL 104 106  CO2 19 21  GLUCOSE 102* 81  BUN 25* 23  CREATININE 1.70* 1.43*  CALCIUM 8.9 8.5   GFR Estimated Creatinine Clearance: 41 ml/min (by C-G formula based on Cr of 1.43). Liver Function Tests:  Recent Labs Lab 08/04/13 0352  AST 38*  ALT 10  ALKPHOS 56  BILITOT 0.5  PROT 6.0  ALBUMIN 2.9*   No results found for this basename: LIPASE, AMYLASE,  in the last 168 hours No results found for this basename: AMMONIA,  in the last 168 hours Coagulation profile  Recent Labs Lab 08/04/13 0352  INR 1.29    CBC:  Recent Labs Lab 08/03/13 1050 08/04/13 0955  WBC 68.5*  68.4* 54.9*  NEUTROABS 54.7* 23.6*  HGB 4.2*  4.0* 7.5*  HCT 12.4*  12.1* 21.9*  MCV 105.1*  105.2* 94.8  PLT 207  216  PLATELET CLUMPS NOTED ON SMEAR, COUNT APPEARS ADEQUATE   Cardiac Enzymes: No results found for this basename: CKTOTAL, CKMB, CKMBINDEX, TROPONINI,  in the last 168 hours BNP: No components found with this basename: POCBNP,  CBG: No results found for this basename: GLUCAP,  in the last 168 hours D-Dimer No results found for this basename: DDIMER,  in the last 72 hours Hgb A1c No results found for this basename: HGBA1C,  in the last 72 hours Lipid Profile No results found for this basename: CHOL, HDL, LDLCALC, TRIG, CHOLHDL, LDLDIRECT,  in the last 72 hours Thyroid function studies No results found for this basename: TSH, T4TOTAL, FREET3, T3FREE, THYROIDAB,  in the last 72 hours Anemia work up  Recent Labs  08/03/13 1050  TIBC NOT CALC  IRON 265*  RETICCTPCT 0.5   Microbiology No results found for this or any previous visit (from the past 240 hour(s)).    Studies:  Dg Chest Port 1 View  08/03/2013   CLINICAL DATA:  Fatigue.  EXAM: PORTABLE CHEST - 1 VIEW  COMPARISON:  05/18/2012  FINDINGS: Cardiac silhouette is normal in size and configuration. Aorta is mildly uncoiled. No mediastinal or hilar masses. Clear lungs.  No pleural effusion or pneumothorax.  Bony thorax is grossly intact.  IMPRESSION: No active disease.   Electronically Signed   By: Lajean Manes M.D.   On: 08/03/2013 11:30    [name]  1. 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 Profound anemia secondary #1.  3. DNR  4. History of Polymyalgia rheumatica  5. History of FUO  6. AKI likely secondary #2  7. Elevated LFTs   We had a detailed discussion regarding his smear was consistent with acute leukemia.  We then discussed supportive care only with transfusions as needed versus hydrea .   Patient stated he wanted to take the pill.  We discussed that his disease is likely terminal but we can make more prognostic information based on cytogenetics.  His poor functional  status.    RECOMMENDATIONS:  1. Please start hydrea 500 mg daily.  2. Consult hospice as patient and family desires no aggressive chemotherapy and he is not a candidate for aggressive chemotherapy.  3. Continue supportive care.   Adisen Bennion, MD 08/04/2013  6:06 PM

## 2013-08-05 LAB — CBC
HCT: 20.3 % — ABNORMAL LOW (ref 39.0–52.0)
Hemoglobin: 7 g/dL — ABNORMAL LOW (ref 13.0–17.0)
MCH: 32.7 pg (ref 26.0–34.0)
MCHC: 34.5 g/dL (ref 30.0–36.0)
MCV: 94.9 fL (ref 78.0–100.0)
PLATELETS: 132 10*3/uL — AB (ref 150–400)
RBC: 2.14 MIL/uL — ABNORMAL LOW (ref 4.22–5.81)
RDW: 19.9 % — AB (ref 11.5–15.5)
WBC: 50.3 10*3/uL — AB (ref 4.0–10.5)

## 2013-08-05 LAB — BASIC METABOLIC PANEL
BUN: 20 mg/dL (ref 6–23)
CHLORIDE: 107 meq/L (ref 96–112)
CO2: 21 mEq/L (ref 19–32)
Calcium: 8.4 mg/dL (ref 8.4–10.5)
Creatinine, Ser: 1.24 mg/dL (ref 0.50–1.35)
GFR calc Af Amer: 60 mL/min — ABNORMAL LOW (ref >90)
GFR, EST NON AFRICAN AMERICAN: 52 mL/min — AB (ref >90)
Glucose, Bld: 83 mg/dL (ref 70–99)
POTASSIUM: 4.4 meq/L (ref 3.7–5.3)
SODIUM: 142 meq/L (ref 137–147)

## 2013-08-05 LAB — PREPARE RBC (CROSSMATCH)

## 2013-08-05 LAB — HEMOGLOBIN AND HEMATOCRIT, BLOOD
HCT: 23.3 % — ABNORMAL LOW (ref 39.0–52.0)
Hemoglobin: 7.8 g/dL — ABNORMAL LOW (ref 13.0–17.0)

## 2013-08-05 MED ORDER — HYDROXYUREA 500 MG PO CAPS
500.0000 mg | ORAL_CAPSULE | Freq: Every day | ORAL | Status: DC
  Filled 2013-08-05: qty 1

## 2013-08-05 NOTE — Care Management Note (Signed)
    Page 1 of 2   08/06/2013     5:29:42 PM   CARE MANAGEMENT NOTE 08/06/2013  Patient:  Jorge Ball, Jorge Ball   Account Number:  [id number]  Date Initiated:  08/05/2013  Documentation initiated by:  Avira Tillison  Subjective/Objective Assessment:   PT ADM ON 3/24 WITH ACUTE LEUKEMIA.  PTA, PT RESIDED AT [place].  DAUGHTER LIVES NEXT DOOR. ([place], [place]).     Action/Plan:   PT NOT A CANDIDATE FOR AGGRESSIVE THERAPY; PLAN TO CONSULT HOSPICE FOR SUPPORTIVE CARE   Anticipated DC Date:  08/06/2013   Anticipated DC Plan:  [place]  CM consult      PAC Choice  HOSPICE   Choice offered to / List presented to:  C-4 Adult Children   DME arranged  BEDSIDE [facility]MODE      DME agency  [place] arranged  HH-1 RN  HH-6 SOCIAL WORKER      Status of service:  Completed, signed off Medicare Important Message given?   (If response is "NO", the following Medicare IM given date fields will be blank) Date Medicare IM given:   Date Additional Medicare IM given:    Discharge Disposition:  [place]  Per UR Regulation:  Reviewed for med. necessity/level of care/duration of stay  If discussed at [place] of Stay Meetings, dates discussed:    Comments:  08/06/13 Robyn Nohr,RN,BSN [phone] P.T. CONSULT COMPLETED; PT DID WELL, WALKED WITH MIN ASSIST AND COMPLETED STAIRS.  SATS STAYED 90 OR ABOVE WITH ALL AMBULATION.  DAUGHTER WAS CONCERNED, AND HAD STATED THAT FAMILY WOULD NOT BE ABLE TO TAKE PT HOME IF HE WAS UNABLE TO AMBULATE W/O DIFFICULTY.  PT TO DC LATER AT HOME WITH HOSPICE TO FOLLOW AT HOME.  BSC TO BE DELIVERED TO HOME LATER TODAY.  MD NOTIFIED TO SIGN GOLD OUT OF FACILITY DNR FORM PRIOR TO DC.  08/05/13 Lyra Alaimo,RN,BSN [phone] SPOKE WITH PT'S Lenor Derrick, ([place] [phone]). SHE STATES WIFE AND REST OF FAMILY DESIRE HOSPICE SUPPORT AT HOME, AND WISH TO USE [facility] FOR  HOSPICE SERVICES.   SHE STATES THAT HER MOTHER IS 84YO, AND NEEDS SUPPORT WITH PT'S ILLNESS.  REFERRAL CALLED IN TO [facility] [phone]; DAUGHTER STATES SHE CAN BE MAIN CONTACT.  WIFE IS [place], HOME # IS [id number].  DAUGHTER UNSURE OF ANY DME NEEDED AT THIS TIME; STATES PT USES RW AT HOME.  WILL FOLLOW FOR HOSPICE F/U.  08/05/13 Rakin Lemelle,RN,BSN [phone] SPOKE WITH PT REGARDING HOSPICE REFERRAL-NO FAMILY AT BEDSIDE PRESENTLY.  PT STATES HE IS NOT SURE WHY HE NEEDS THIS; IS AGREEABLE TO ME CALLING HIS FAMILY TO DISCUSSING WITH THEM, AND ARRANGING IF DESIRED.

## 2013-08-05 NOTE — Progress Notes (Signed)
Jorge Ball   DOB:[birth year]   HA#:[id number]   OVA#:[id number]  Subjective: Patient anxious to go home.  Multiple family members are at the bedside. They spoke with Hospice Nurse Lesleigh Noe.  Patient received one additional unit of blood.  Refused PT today.   Objective:  Filed Vitals:   08/05/13 1353  BP: 116/47  Pulse: 41  Temp: 97.9 F (36.6 C)  Resp: 18    Body mass index is 25.81 kg/(m^2).  Intake/Output Summary (Last 24 hours) at 08/05/13 1943 Last data filed at 08/05/13 1930  Gross per 24 hour  Intake  927.5 ml  Output    525 ml  Net  402.5 ml   PE: ECOG 3 General: Laying in bed, AAOx3 Skin: No bruising. HEENT: OP clear no lesions CV: Bradycardic; S1S2 present; Lungs: decreased breath sounds at bases. Abdomen: S/NT/ND +BS Ext: No edema. Neuro: Moves all 4 extremities.   CBG (last 3)  No results found for this basename: GLUCAP,  in the last 72 hours   Labs:  Lab Results  Component Value Date   WBC 50.3* 08/05/2013   HGB 7.8* 08/05/2013   HCT 23.3* 08/05/2013   MCV 94.9 08/05/2013   PLT 132* 08/05/2013   NEUTROABS 23.6* 08/04/2013    _0 @  Urine Studies No results found for this basename: UACOL, UAPR, USPG, UPH, UTP, UGL, UKET, UBIL, UHGB, UNIT, UROB, ULEU, UEPI, UWBC, URBC, UBAC, CAST, CRYS, UCOM, BILUA,  in the last 72 hours  Basic Metabolic Panel:  Recent Labs Lab 08/03/13 1050 08/04/13 0352 08/05/13 0410  NA 140 142 142  K 4.7 4.5 4.4  CL 104 106 107  CO2 _1 GLUCOSE 102* 81 83  BUN 25* 23 20  CREATININE 1.70* 1.43* 1.24  CALCIUM 8.9 8.5 8.4   GFR Estimated Creatinine Clearance: 47.2 ml/min (by C-G formula based on Cr of 1.24). Liver Function Tests:  Recent Labs Lab 08/04/13 0352  AST 38*  ALT 10  ALKPHOS 56  BILITOT 0.5  PROT 6.0  ALBUMIN 2.9*   No results found for this basename: LIPASE, AMYLASE,  in the last 168 hours No results found for this basename: AMMONIA,  in the last 168 hours Coagulation profile  Recent  Labs Lab 08/04/13 0352  INR 1.29    CBC:  Recent Labs Lab 08/03/13 1050 08/04/13 0955 08/05/13 0410 08/05/13 1415  WBC 68.5*  68.4* 54.9* 50.3*  --   NEUTROABS 54.7* 23.6*  --   --   HGB 4.2*  4.0* 7.5* 7.0* 7.8*  HCT 12.4*  12.1* 21.9* 20.3* 23.3*  MCV 105.1*  105.2* 94.8 94.9  --   PLT 207  216 PLATELET CLUMPS NOTED ON SMEAR, COUNT APPEARS ADEQUATE 132*  --    Cardiac Enzymes: No results found for this basename: CKTOTAL, CKMB, CKMBINDEX, TROPONINI,  in the last 168 hours BNP: No components found with this basename: POCBNP,  CBG: No results found for this basename: GLUCAP,  in the last 168 hours D-Dimer No results found for this basename: DDIMER,  in the last 72 hours Hgb A1c No results found for this basename: HGBA1C,  in the last 72 hours Lipid Profile No results found for this basename: CHOL, HDL, LDLCALC, TRIG, CHOLHDL, LDLDIRECT,  in the last 72 hours Thyroid function studies No results found for this basename: TSH, T4TOTAL, FREET3, T3FREE, THYROIDAB,  in the last 72 hours Anemia work up  Recent Labs  08/03/13 1050  TIBC NOT CALC  IRON 265*  RETICCTPCT  0.5   Microbiology No results found for this or any previous visit (from the past 240 hour(s)).  Peripheral Blood Flow Cytometry - SIGNIFICANT INCREASE IN MYELOBLASTIC CELLS PRESENT. Susanne Greenhouse MD Pathologist, Electronic Signature (Case signed 08/05/2013) GROSS AND MICROSCOPIC INFORMATION Source Peripheral Blood Flow Cytometry Microscopic Gated population: Flow cytometric immunophenotyping is performed using antibiodies to the antigens listed in the table below. Electronic gates are placed around a cell cluster displaying light scatter properties corresponding to blasts. - Abnormal Cells in sample: 43 % - Phenotype of Abnormal Cells: CD13, CD33, CD34, CD117, HLA-Dr1   Reviewed By Violet Baldy, M.D. Comment: 08/04/13 NUMEROUS CIRCULATING BLASTS FLOW CYTOMETRY WILL BE PERFORMED Performed at  [facility]  Collected: 08/03/13 1050 Resulting lab: [facility] Value: Acute leukemia. Comment: Reviewed by Mali R. Rund, M.D. 08/03/13  Studies:  No results found.  [name]  1. 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  2. Profound anemia secondary #1.  3. DNR  4. History of Polymyalgia rheumatica  5. History of FUO  6. AKI likely secondary #2  7. Elevated LFTs   We had a detailed discussion regarding his smear was consistent with acute leukemia.  And flow cytometry is as reported above consistent with myeloblastic leukemia.  We will perform a bone marrow biopsy and the patient and the family is agreeable. We discussed that his disease is likely terminal but we can make more prognostic information based on cytogenetics obtained from his biopsy.  His poor functional status makes aggressive chemotherapy less of an option presently.    RECOMMENDATIONS:  1. Ordered Ct guided bone marrow biopsy.  Send for cytogenetics.  2. Prognosis is likely less than 6 months based on peripheral blood with greater than 43 abnormal cells, persistent anemia.  We will better prognosticate with degree of involvement in the bone marrow.  3. Continue supportive care.   Lynda Capistran, MD 08/05/2013  7:43 PM

## 2013-08-05 NOTE — Progress Notes (Addendum)
Physical Therapy Discharge Patient Details Name: Jorge Ball MRN: [id number] DOB: March 16, 1927 Today's Date: 08/05/2013    History of Present Illness Jorge Ball is a 80 y.o. male with a history of polymyalgia rheumatica on prednisone who was referred to [facility] ER secondary to leukocytosis and profound anemia by his PCP (Dr. Nyoka Cowden).  Patient reported progressive fatigue over the past several months. .  His hemoglobin was 4.4 with a WBC of 70k at his PCP's office.     PT Comments    Pt refusing to participate with PT. States he has been up walking to bathroom with nursing assist (RN confirms). He does not feel he needs education on stair training. We discussed his current home equipment and denies needing any other equipment. Family not present to discuss their concerns and when asked pt if I may call his family to discuss any questions/concerns they may have he told me not to. Noted plan is for home with Hospice. Hospice will be able to coordinate with family if any equipment needs arise. PT is signing off due to pt's refusal of services.   Follow Up Recommendations  No PT follow up (pt refusing PT)  Pt to have Hospice services.     Equipment Recommendations  None recommended by PT (already owns a RW)   Recommendations for Other Services       Precautions / Restrictions Precautions Precautions: Fall Precaution Comments: 2 recent falls     Mobility  Bed Mobility                  Transfers                    Ambulation/Gait                 Stairs            Wheelchair Mobility    Modified Rankin (Stroke Patients Only)       Balance                                    Cognition Arousal/Alertness: Awake/alert Behavior During Therapy: Flat affect Overall Cognitive Status: No family/caregiver present to determine baseline cognitive functioning                      Exercises      General Comments         Pertinent Vitals/Pain no apparent distress     Home Living                      Prior Function            PT Goals (current goals can now be found in the care plan section) Progress towards PT goals: Not progressing toward goals - comment (Pt refusing services)    Frequency       PT Plan Discharge plan needs to be updated    End of Session  Patient is being discharged from PT services secondary to:  Patient has refused PT services  Please see latest Therapy Progress Note for current level of functioning and progress toward goals.  Progress and discharge plan and discussed with patient/caregiver and they:  Agree  No caregivers available            Time: 1445-1450 PT Time Calculation (min): 5 min  Charges:    No charge  G Codes:      Suliman Termini 08/05/2013, 3:00 PM Pager [phone]

## 2013-08-05 NOTE — Progress Notes (Signed)
PROGRESS NOTE  Jorge Ball QMG:[id number] DOB: 08-28-1926 DOA: 08/03/2013 PCP: Criselda Peaches, MD  Assessment/Plan: Acute leukemia - Oncology evaluated patient, appreciate input. No great options for treatment and not a candidate for aggressive chemotherapy. Hospice consulted, will need to arrange for home.  Symptomatic anemia - s/p 4U pRBC, Hb improved. Hb borderline this morning to 7, transfuse 1 additional unit.  Leukocytosis - due to #1 AKI - improved with fluids.  ETOH abuse - has been drinking daily for a long time, less in the past years, now down to 2-3 drinks per day. Beer and liquor. He does not think he has a problem. Does not appear to be withdrawing.   Diet: regular Fluids: NS DVT Prophylaxis: SCDs  Code Status: DNR Family Communication: daughter and wife bedside  Disposition Plan: inpatient, home when ready   Consultants:  Oncology  Procedures:  none   Antibiotics - none  HPI/Subjective: - feeling better, does not know why he is here, and wants to go home.   Objective: Filed Vitals:   08/05/13 0359 08/05/13 0939 08/05/13 1020 08/05/13 1119  BP: 120/77 118/55 120/48 120/59  Pulse: 59 42 57 60  Temp: 97.9 F (36.6 C) 98.4 F (36.9 C) 98.5 F (36.9 C) 98.7 F (37.1 C)  TempSrc: Oral Oral Oral Oral  Resp: 18 18 20 16   Height:      Weight:      SpO2: 97% 96%      Intake/Output Summary (Last 24 hours) at 08/05/13 1226 Last data filed at 08/05/13 1133  Gross per 24 hour  Intake  742.5 ml  Output    726 ml  Net   16.5 ml   Filed Weights   08/03/13 1029 08/03/13 1733  Weight: 83.915 kg (185 lb) 83.9 kg (184 lb 15.5 oz)   Exam:  General:  NAD  Cardiovascular: regular rate and rhythm, without MRG  Respiratory: good air movement, clear to auscultation throughout, no wheezing, ronchi or rales  Abdomen: soft, not tender to palpation, positive bowel sounds  MSK: no peripheral edema  Neuro: CN 2-12 grossly intact, MS 5/5 in all 4  Data  Reviewed: Basic Metabolic Panel:  Recent Labs Lab 08/03/13 1050 08/04/13 0352 08/05/13 0410  NA 140 142 142  K 4.7 4.5 4.4  CL 104 106 107  CO2 19 21 21   GLUCOSE 102* 81 83  BUN 25* 23 20  CREATININE 1.70* 1.43* 1.24  CALCIUM 8.9 8.5 8.4   Liver Function Tests:  Recent Labs Lab 08/04/13 0352  AST 38*  ALT 10  ALKPHOS 56  BILITOT 0.5  PROT 6.0  ALBUMIN 2.9*   CBC:  Recent Labs Lab 08/03/13 1050 08/04/13 0955 08/05/13 0410  WBC 68.5*  68.4* 54.9* 50.3*  NEUTROABS 54.7* 23.6*  --   HGB 4.2*  4.0* 7.5* 7.0*  HCT 12.4*  12.1* 21.9* 20.3*  MCV 105.1*  105.2* 94.8 94.9  PLT 207  216 PLATELET CLUMPS NOTED ON SMEAR, COUNT APPEARS ADEQUATE 132*   BNP (last 3 results)  Recent Labs  08/03/13 1104  PROBNP 1238.0*   Studies: No results found.  Scheduled Meds: . predniSONE  10 mg Oral Daily  . sodium chloride  3 mL Intravenous Q12H   Continuous Infusions: . sodium chloride Stopped (08/03/13 1607)    Active Problems:   Anemia   Leukocytosis   Acute kidney failure   FTT (failure to thrive) in adult   Time spent: 35  This note has been created  with Surveyor, quantity. Any transcriptional errors are unintentional.   Marzetta Board, MD [facility] Pager [phone]. If 7 PM - 7 AM, please contact night-coverage at [email or web address], password [facility] 08/05/2013, 12:26 PM  LOS: 2 days

## 2013-08-05 NOTE — Progress Notes (Signed)
Notified by Sandra Cockayne, patient and family request services of [facility]) after discharge.   Spoke with with daughter Kaylyn Lim [phone]) who informed she her 2 brothers and mother will be coming to the hospital this afternoon to talk with Dr Juliann Mule; briefly discussed hospice services, philosophy and team approach to care -Santiago Glad voiced good understanding of information provided; she stated she and her mother are very concerned about pt's ability to get around at home; she voiced her dad says he is able to take care of himself when often this is not the case. She is especially concerned about him getting up in the middle of the night and falling.   -Discussed safety issues - perhaps use of BSC, walker - Did speak with Santiago Glad about the fact that PT attempted to evaluate her dad today but he refused - she would like for this evaluation to be pursued to have an idea of his ability to mobilize; discussed what supportive/comfort care at home would look like; would the family want pt to return to the hospital - Santiago Glad stated she was hopeful to have this discussion with Dr Juliann Mule and her father tonight  -Santiago Glad also questioned if her father was not physically able to be at home what would the best alternative be- she asked about residential hospice and writer encouraged her to discuss with Dr Juliann Mule as it was not clear pt was at that point in his disease process; Probation officer did inform residential hospice is an option for transition from home; also discussed SNF for conditioning if her dad was agreeable  -Probation officer spoke with patient alone at bedside; he stated he was waiting for his family to come; he informed he was aware the family wanted to involve hospice in his care and he was 'ok' with this- he stated he felt that this meant he had 'about a year to live' - I gently discussed with Jorge Ball that with no aggressive treatment, hospice eligibility criteria is 6 months or less and  encouraged him to talk with Dr Juliann Mule and his current attending physician  Jorge Ball stated he wanted to 'get back to home to his usual routine', he says this involves "walking around his yard -not doing yardwork but enjoying his yard, eating and being in his recliner watching his TV programs"  -when asked if he would let physical therapy evaluate him to see how steady he was and how he did getting up and around so we could be sure he would be safe at home he was agreeable;  Database administrator asked Jorge Ball if his blood levels dropped at home again would he want to be coming back to the hospital and he said 'he would need to think about it' Jorge Ball stated he knew his family was coming in at 5 pm or so and they were going to talk with the doctor; Jorge Ball was calm and reasonable during this interaction; stated awareness of his life limiting condition Writer told pt and earlier daughter on the phone that [place] will follow up in the morning to determine final decisions.   Jorge Sewer, RN 08/05/2013, 5:12 PM [facility] RN Liaison [phone]

## 2013-08-06 ENCOUNTER — Inpatient Hospital Stay (HOSPITAL_COMMUNITY): Payer: Medicare Other

## 2013-08-06 LAB — TYPE AND SCREEN
ABO/RH(D): A NEG
ANTIBODY SCREEN: NEGATIVE
UNIT DIVISION: 0
UNIT DIVISION: 0
Unit division: 0
Unit division: 0

## 2013-08-06 LAB — CBC
HEMATOCRIT: 23.8 % — AB (ref 39.0–52.0)
HEMOGLOBIN: 8.2 g/dL — AB (ref 13.0–17.0)
MCH: 32.8 pg (ref 26.0–34.0)
MCHC: 34.5 g/dL (ref 30.0–36.0)
MCV: 95.2 fL (ref 78.0–100.0)
Platelets: 116 10*3/uL — ABNORMAL LOW (ref 150–400)
RBC: 2.5 MIL/uL — AB (ref 4.22–5.81)
RDW: 18.6 % — ABNORMAL HIGH (ref 11.5–15.5)
WBC: 51.1 10*3/uL (ref 4.0–10.5)

## 2013-08-06 MED ORDER — FENTANYL CITRATE 0.05 MG/ML IJ SOLN
INTRAMUSCULAR | Status: AC
  Filled 2013-08-06: qty 4

## 2013-08-06 MED ORDER — MIDAZOLAM HCL 2 MG/2ML IJ SOLN
INTRAMUSCULAR | Status: AC
  Filled 2013-08-06: qty 4

## 2013-08-06 MED ORDER — LIDOCAINE HCL 1 % IJ SOLN
INTRAMUSCULAR | Status: AC
  Filled 2013-08-06: qty 10

## 2013-08-06 MED ORDER — FENTANYL CITRATE 0.05 MG/ML IJ SOLN
INTRAMUSCULAR | Status: AC | PRN
  Administered 2013-08-06: 50 ug via INTRAVENOUS

## 2013-08-06 NOTE — Discharge Summary (Signed)
Physician Discharge Summary  OM LIZOTTE MWN:[id number] DOB: 09-11-1926 DOA: 08/03/2013  PCP: Criselda Peaches, MD  Admit date: 08/03/2013 Discharge date: 08/06/2013  Time spent: 35 minutes  Recommendations for Outpatient Follow-up:  1. Follow up with PCP in 1-2 weeks 2. Follow up with Dr. Juliann Mule in 1-2 weeks 3. Follow up with hospice services at home tonight 4. For hospice, please draw a CBC on Monday and forward the results to Dr. Juliann Mule   Recommendations for primary care physician for things to follow:  CBC  Discharge Diagnoses:  Active Problems:   Falls   Anemia   Leukocytosis   Acute kidney failure   FTT (failure to thrive) in adult  Discharge Condition: guarded, with hospice.   Diet recommendation: regular, avoid alcohol  Filed Weights   08/03/13 1029 08/03/13 1733  Weight: 83.915 kg (185 lb) 83.9 kg (184 lb 15.5 oz)   History of present illness:  Jorge Ball is a 80 y.o. male with a past medical history of fever of unknown origin who was admitted back in 2013 for workup of fever of unknown origin. It appears that during that hospitalization he was found to be thrombocytopenic having a platelet count of 54,000 on 11/06/2011. Patient having low-grade fevers intermittently since then and was diagnosed with polymyalgia rheumatica in the outpatient setting and was started on steroid therapy. Family members reporting patient having a gradual functional decline since October of 2014, with a steep functional decline over the past week. Patient becoming increasingly weak, lethargic, unintentional weight loss, now requiring significant assistance with ambulation, minimal by mouth intake, poor tolerance to physical exertion. He went to his primary care physician's office today where he was found to have a hemoglobin of 4 with white blood cell count of 70,000. Patient was referred to the emergency room where initial lab work showed a hemoglobin of 4.2 with white blood cell count of  16,500. Labs also revealed the presence of acute kidney injury having creatinine of 1.7 with BUN of 25. He had a normal kidney function in July of 2013. Of note his platelet count was 207,000. He denies chest pain or shortness of breath. He also denies like black tarry stools, bright red blood per rectum, hematemesis. Patient found to be guaiac-negative in the emergency room.   Hospital Course:  Acute leukemia - Oncology evaluated patient, appreciate input. No great options for treatment and not a candidate for aggressive chemotherapy. Hospice consulted and patient will be discharged home with hospice. Advised hospice to draw a CBC on Monday 3/31 and forward results to Oncology, and patient may need further transfusions. An attempt was made for a bone marrow biopsy on 3/27 however patient did not tolerate the procedure due to uncomfortable positioning and had to be aborted.  Symptomatic anemia - s/p 5U pRBC, Hb improved from 4.2 on admission to 8.2 on discharge. Repeat CBC on Monday Leukocytosis - due to #1  AKI - improved with fluids, renal function close to normal on discharge.  ETOH use - has been drinking daily for a long time, less in the past years, now down to 2-3 drinks per day. Patient advised to avoid alcohol as this may impair his balance and may increase his risk of falls.  Weakness - due to #1 and symptomatic anemia, this has improved after blood transfusions. PT evaluated patient and recommended no PT follow up, he is to continue using his walker at home and a bedside commode, which has been prescribed. Hospice will  further evaluate patient longitudinally, and if there will be sings of decline residential hospice will be considered.  Procedures:  None   Consultations:  Oncology  Discharge Exam: Filed Vitals:   08/06/13 0518 08/06/13 1159 08/06/13 1439 08/06/13 1453  BP: 125/53 127/70    Pulse: 59 81 85 120  Temp: 98.5 Jorge (36.9 C) 98.9 Jorge (37.2 C)    TempSrc: Oral Oral      Resp: 18 18    Height:      Weight:      SpO2: 96% 91% 93% 90%   General: NAD Cardiovascular: RRR Respiratory: CTA biL  Discharge Instructions    Medication List         multivitamin with minerals tablet  Take 1 tablet by mouth daily.     predniSONE 10 MG tablet  Commonly known as:  DELTASONE  Take 10 mg by mouth daily.     vitamin C 250 MG tablet  Commonly known as:  ASCORBIC ACID  Take 250 mg by mouth daily.           Follow-up Information   Follow up with GREEN, EDWIN JAY, MD. Schedule an appointment as soon as possible for a visit in 1 week.   Specialty:  Internal Medicine   Contact information:   [address] [place] [address] [phone]       Follow up with CHISM, DAVID, MD. Schedule an appointment as soon as possible for a visit in 1 week.   Specialty:  Internal Medicine   Contact information:   [place] [address] [phone]       The results of significant diagnostics from this hospitalization (including imaging, microbiology, ancillary and laboratory) are listed below for reference.    Significant Diagnostic Studies: Dg Chest Port 1 View  08/03/2013   CLINICAL DATA:  Fatigue.  EXAM: PORTABLE CHEST - 1 VIEW  COMPARISON:  05/18/2012  FINDINGS: Cardiac silhouette is normal in size and configuration. Aorta is mildly uncoiled. No mediastinal or hilar masses. Clear lungs. No pleural effusion or pneumothorax.  Bony thorax is grossly intact.  IMPRESSION: No active disease.   Electronically Signed   By: Lajean Manes M.D.   On: 08/03/2013 11:30   Labs: Basic Metabolic Panel:  Recent Labs Lab 08/03/13 1050 08/04/13 0352 08/05/13 0410  NA 140 142 142  K 4.7 4.5 4.4  CL 104 106 107  CO2 _0 GLUCOSE 102* 81 83  BUN 25* 23 20  CREATININE 1.70* 1.43* 1.24  CALCIUM 8.9 8.5 8.4   Liver Function Tests:  Recent Labs Lab 08/04/13 0352  AST 38*  ALT 10  ALKPHOS 56  BILITOT 0.5  PROT 6.0  ALBUMIN 2.9*    CBC:  Recent Labs Lab 08/03/13 1050 08/04/13 0955 08/05/13 0410 08/05/13 1415 08/06/13 0235  WBC 68.5*  68.4* 54.9* 50.3*  --  51.1*  NEUTROABS 54.7* 23.6*  --   --   --   HGB 4.2*  4.0* 7.5* 7.0* 7.8* 8.2*  HCT 12.4*  12.1* 21.9* 20.3* 23.3* 23.8*  MCV 105.1*  105.2* 94.8 94.9  --  95.2  PLT 207  216 PLATELET CLUMPS NOTED ON SMEAR, COUNT APPEARS ADEQUATE 132*  --  116*   BNP: BNP (last 3 results)  Recent Labs  08/03/13 1104  PROBNP 1238.0*   Signed:  Marzetta Board  [facility] 08/06/2013, 3:51 PM

## 2013-08-06 NOTE — Sedation Documentation (Signed)
Procedure canceled due to pt inability to position on the table

## 2013-08-06 NOTE — Discharge Instructions (Signed)

## 2013-08-06 NOTE — Evaluation (Signed)
Physical Therapy Evaluation Patient Details Name: Jorge Ball MRN: [id number] DOB: 1926/11/25 Today's Date: 08/06/2013   History of Present Illness  80 y.o. male admitted to [facility] on 08/03/13 from his 81 office due to anemia, increased WBC count and increased platelet count.  Per chart family reported increased weakness general decline over the past several months.  Pt dx with leukemia, anemia, and acute kidney injury due to anemia. Pt with significant PMHx of polymalgia rheumatica and fever of unknown origin.  No other history listed in the chart.    Clinical Impression  Asked to re-consult on patient despite PT signing off on 08/05/13 due to multiple refusals. Pt is agreeable to work with PT today due to the incentive to go home.   He does need to use the RW for gait, was able to show that he had the leg strength to go up and down the steps to enter his home, but is generally very little assist overall (supervision to min assist). He needs to use the RW at all times now and would benefit from HHPT and a 3-in-1 for use at night (as this seems to be the time when he gets up without any supervision).   PT to follow acutely for deficits listed below.       Follow Up Recommendations No PT follow up    Equipment Recommendations  3in1 (PT)    Recommendations for Other Services   None    Precautions / Restrictions Precautions Precautions: Fall Precaution Comments: 2 recent falls       Mobility  Bed Mobility Overal bed mobility: Needs Assistance Bed Mobility: Supine to Sit;Sit to Supine     Supine to sit: Min assist;HOB elevated Sit to supine: Supervision   General bed mobility comments: Min assist to give pt a hand to pull to sitting  Transfers Overall transfer level: Needs assistance Equipment used: Rolling walker (2 wheeled) Transfers: Sit to/from Stand Sit to Stand: Min guard         General transfer comment: min guard assist from lower surfaces.  Pt using momentum  to get up to his feet  Ambulation/Gait Ambulation/Gait assistance: Min guard Ambulation Distance (Feet): 150 Feet Assistive device: Rolling walker (2 wheeled) Gait Pattern/deviations: Step-through pattern;Shuffle;Trunk flexed Gait velocity: decreased Gait velocity interpretation: Below normal speed for age/gender General Gait Details: Pt walking with flexed trunk slow speed.  Generally steady on his feet min guard assist needed for safety  Stairs Stairs: Yes Stairs assistance: Supervision Stair Management: One rail Right;Forwards;Alternating pattern Number of Stairs: 3 General stair comments: supervision for safety.  Reliance on upper extremity support to walk.        Balance Overall balance assessment: History of Falls;Needs assistance Sitting-balance support: Feet supported Sitting balance-Leahy Scale: Good     Standing balance support: Bilateral upper extremity supported Standing balance-Leahy Scale: Poor Standing balance comment: pt needs upper extremity support for balance.                      Pertinent Vitals/Pain O2 sats in the low 90s throughout session despite 2/4 DOE with gait. HR on monitor read 122 bpm and A-fib, but I am not sure how accurate this was because when we sat back down he had a lead off.  Pt's DOE recovers quickly with a seated rest break.     Home Living Family/patient expects to be discharged to:: Private residence Living Arrangements: Spouse/significant other Available Help at Discharge: Family;Available 24 hours/day Type of  Home: House Home Access: Stairs to enter Entrance Stairs-Rails: Right Entrance Stairs-Number of Steps: 3 Home Layout: One level Home Equipment: Walker - 2 wheels Additional Comments: per MD pt sleeps in the recliner chair    Prior Function Level of Independence: Independent with assistive device(s)         Comments: pt reports that he ambulated when he needs to with RW.       Hand Dominance   Dominant  Hand: Right    Extremity/Trunk Assessment   Upper Extremity Assessment: Overall WFL for tasks assessed           Lower Extremity Assessment: Generalized weakness      Cervical / Trunk Assessment: Other exceptions  Communication   Communication: HOH  Cognition Arousal/Alertness: Awake/alert Behavior During Therapy: Flat affect Overall Cognitive Status: Impaired/Different from baseline Area of Impairment: Safety/judgement;Awareness;Problem solving;Orientation;Attention Orientation Level: Disoriented to;Situation (pt cannot remember circumstances around coming to hospital) Current Attention Level: Sustained     Safety/Judgement: Decreased awareness of safety;Decreased awareness of deficits Awareness: Emergent Problem Solving: Difficulty sequencing;Requires verbal cues;Requires tactile cues General Comments: Pt unable to see or recognize that everything he asked for before I left the room was present next to him, even after multiple reinforcement of where everything was.               Assessment/Plan    PT Assessment Patient needs continued PT services  PT Diagnosis Difficulty walking;Abnormality of gait;Generalized weakness;Altered mental status   PT Problem List Decreased strength;Decreased activity tolerance;Decreased balance;Decreased mobility;Decreased cognition;Decreased knowledge of use of DME;Decreased safety awareness;Decreased knowledge of precautions;Cardiopulmonary status limiting activity  PT Treatment Interventions DME instruction;Gait training;Stair training;Functional mobility training;Therapeutic activities;Therapeutic exercise;Balance training;Neuromuscular re-education;Cognitive remediation;Patient/family education   PT Goals (Current goals can be found in the Care Plan section) Acute Rehab PT Goals Patient Stated Goal: to go home today PT Goal Formulation: With patient Time For Goal Achievement: 08/20/13 Potential to Achieve Goals: Good    Frequency  Min 3X/week   Barriers to discharge  None      End of Session   Activity Tolerance: Patient limited by fatigue;Other (comment) (limited by DOE, increased rest breaks) Patient left: in bed;with call bell/phone within reach         Time: 1439-1510 PT Time Calculation (min): 31 min   Charges:   PT Evaluation $Initial PT Evaluation Tier I: 1 Procedure PT Treatments $Gait Training: 8-22 mins        Dorothea Yow B. [place], [place], DPT [phone]   08/06/2013, 3:50 PM

## 2013-08-06 NOTE — Progress Notes (Signed)
Follow-up: spoke with pt's daughter early this morning who indicated after talking with Dr Juliann Mule last evening the plan was for a bone marrow biopsy and home with hospice following; if needed they wish for pt to have transfusions; they are aware [facility] does not transport to/from doctor appointments; [facility] team will discuss options of [facility] volunteer being available for transportation needs if pt is able to ambulate to/from car Spoke with Dr Letta Median who informed pt was unable to get biopsy as he could not tolerate position; spoke with pt at bedside who was able to inform writer of events of this morning and stated he did not take any pain medication when offered during procedure because he knew it would make him not be as alert and able to get up and use walker; he stated he is agreeable to working with PT and then would be looking forward to going home - per PT note pt able to show he had leg strength and could use walker with minimal assist; Writer spoke with Dr Letta Median and [facility] Almyra Free who informed of plans to move forward with discharge today; [facility] is aware of discharge order and will plan to see pt after discharge  Writer informed by [facility] that 3n1 BSC recommended by PT- pt currently has walker in the home, [facility] to be requested by [facility] for home delivery. [facility] DME list had been left in pt's room earlier today and team will follow up on any additional; equipment needs once home Writer plans to contact daughter, Santiago Glad to confirm [facility] will see pt/family  Danton Sewer, RN  [phone]

## 2013-08-06 NOTE — Sedation Documentation (Signed)
Pt is having pain verbal order to give patient 50 mcg of fentanyl ivp from art hoss, md

## 2013-08-06 NOTE — H&P (Signed)
Jorge Ball is an 80 y.o. male.   Chief Complaint: pt with progressive weakness and fatigue Found to be anemic and with leukocytosis at PCP Evaluated by hematology and Bone Marrow Biopsy has been requested  HPI: anemia; leukocytosis; acute kidney failure; FTT  History reviewed. No pertinent past medical history.  Past Surgical History  Procedure Laterality Date  . Vasectomy      History reviewed. No pertinent family history. Social History:  reports that he has quit smoking. He has never used smokeless tobacco. He reports that he drinks about 0.6 ounces of alcohol per week. He reports that he does not use illicit drugs.  Allergies: No Known Allergies  Medications Prior to Admission  Medication Sig Dispense Refill  . Multiple Vitamins-Minerals (MULTIVITAMIN WITH MINERALS) tablet Take 1 tablet by mouth daily.      . predniSONE (DELTASONE) 10 MG tablet Take 10 mg by mouth daily.      . vitamin C (ASCORBIC ACID) 250 MG tablet Take 250 mg by mouth daily.        Results for orders placed during the hospital encounter of 08/03/13 (from the past 48 hour(s))  CBC WITH DIFFERENTIAL     Status: Abnormal   Collection Time    08/04/13  9:55 AM      Result Value Ref Range   WBC 54.9 (*) 4.0 - 10.5 K/uL   Comment: CRITICAL RESULT CALLED TO, READ BACK BY AND VERIFIED WITH:     L.TURNER,RN 1050 08/04/13 CLARK,S   RBC 2.31 (*) 4.22 - 5.81 MIL/uL   Hemoglobin 7.5 (*) 13.0 - 17.0 g/dL   Comment: POST TRANSFUSION SPECIMEN     REPEATED TO VERIFY   HCT 21.9 (*) 39.0 - 52.0 %   MCV 94.8  78.0 - 100.0 fL   Comment: POST TRANSFUSION SPECIMEN     REPEATED TO VERIFY   MCH 32.5  26.0 - 34.0 pg   MCHC 34.2  30.0 - 36.0 g/dL   RDW 20.4 (*) 11.5 - 15.5 %   Platelets    150 - 400 K/uL   Value: PLATELET CLUMPS NOTED ON SMEAR, COUNT APPEARS ADEQUATE   Neutrophils Relative % 12 (*) 43 - 77 %   Lymphocytes Relative 57 (*) 12 - 46 %   Monocytes Relative 0 (*) 3 - 12 %   Eosinophils Relative 0  0 - 5 %    Basophils Relative 0  0 - 1 %   Band Neutrophils 0  0 - 10 %   Metamyelocytes Relative 4     Myelocytes 27     Promyelocytes Absolute 0     Blasts 0     nRBC 0  0 /100 WBC   Neutro Abs 23.6 (*) 1.7 - 7.7 K/uL   Lymphs Abs 31.3 (*) 0.7 - 4.0 K/uL   Monocytes Absolute 0.0 (*) 0.1 - 1.0 K/uL   Eosinophils Absolute 0.0  0.0 - 0.7 K/uL   Basophils Absolute 0.0  0.0 - 0.1 K/uL   WBC Morphology       Value: MODERATE LEFT SHIFT (>5% METAS AND MYELOS,OCC PRO NOTED)   Comment: ATYPICAL MONONUCLEAR CELLS  PATHOLOGIST SMEAR REVIEW     Status: None   Collection Time    08/04/13  9:55 AM      Result Value Ref Range   Path Review Reviewed By Violet Baldy, M.D.     Comment: 08/04/13     NUMEROUS CIRCULATING BLASTS     FLOW CYTOMETRY WILL  BE PERFORMED     Performed at [facility]  CBC     Status: Abnormal   Collection Time    08/05/13  4:10 AM      Result Value Ref Range   WBC 50.3 (*) 4.0 - 10.5 K/uL   Comment: CRITICAL VALUE NOTED.  VALUE IS CONSISTENT WITH PREVIOUSLY REPORTED AND CALLED VALUE.     REPEATED TO VERIFY   RBC 2.14 (*) 4.22 - 5.81 MIL/uL   Hemoglobin 7.0 (*) 13.0 - 17.0 g/dL   HCT 20.3 (*) 39.0 - 52.0 %   MCV 94.9  78.0 - 100.0 fL   MCH 32.7  26.0 - 34.0 pg   MCHC 34.5  30.0 - 36.0 g/dL   RDW 19.9 (*) 11.5 - 15.5 %   Platelets 132 (*) 150 - 400 K/uL  BASIC METABOLIC PANEL     Status: Abnormal   Collection Time    08/05/13  4:10 AM      Result Value Ref Range   Sodium 142  137 - 147 mEq/L   Potassium 4.4  3.7 - 5.3 mEq/L   Chloride 107  96 - 112 mEq/L   CO2 21  19 - 32 mEq/L   Glucose, Bld 83  70 - 99 mg/dL   BUN 20  6 - 23 mg/dL   Creatinine, Ser 1.24  0.50 - 1.35 mg/dL   Calcium 8.4  8.4 - 10.5 mg/dL   GFR calc non Af Amer 52 (*) >90 mL/min   GFR calc Af Amer 60 (*) >90 mL/min   Comment: (NOTE)     The eGFR has been calculated using the CKD EPI equation.     This calculation has not been validated in all clinical situations.     eGFR's  persistently <90 mL/min signify possible Chronic Kidney     Disease.  PREPARE RBC (CROSSMATCH)     Status: None   Collection Time    08/05/13  7:19 AM      Result Value Ref Range   Order Confirmation BB SAMPLE OR UNITS ALREADY AVAILABLE    HEMOGLOBIN AND HEMATOCRIT, BLOOD     Status: Abnormal   Collection Time    08/05/13  2:15 PM      Result Value Ref Range   Hemoglobin 7.8 (*) 13.0 - 17.0 g/dL   HCT 23.3 (*) 39.0 - 52.0 %  CBC     Status: Abnormal   Collection Time    08/06/13  2:35 AM      Result Value Ref Range   WBC 51.1 (*) 4.0 - 10.5 K/uL   Comment: WHITE COUNT CONFIRMED ON SMEAR     REPEATED TO VERIFY     CRITICAL VALUE NOTED.  VALUE IS CONSISTENT WITH PREVIOUSLY REPORTED AND CALLED VALUE.   RBC 2.50 (*) 4.22 - 5.81 MIL/uL   Hemoglobin 8.2 (*) 13.0 - 17.0 g/dL   HCT 23.8 (*) 39.0 - 52.0 %   MCV 95.2  78.0 - 100.0 fL   MCH 32.8  26.0 - 34.0 pg   MCHC 34.5  30.0 - 36.0 g/dL   RDW 18.6 (*) 11.5 - 15.5 %   Platelets 116 (*) 150 - 400 K/uL   Comment: SPECIMEN CHECKED FOR CLOTS     REPEATED TO VERIFY     PLATELET COUNT CONFIRMED BY SMEAR   No results found.  Review of Systems  Constitutional: Negative for fever and weight loss.  Cardiovascular: Negative for chest pain.  Gastrointestinal: Negative  for nausea, vomiting and abdominal pain.  Musculoskeletal: Positive for falls.  Neurological: Positive for dizziness and weakness. Negative for headaches.    Blood pressure 125/53, pulse 59, temperature 98.5 F (36.9 C), temperature source Oral, resp. rate 18, height _0  (1.803 m), weight 83.9 kg (184 lb 15.5 oz), SpO2 96.00%. Physical Exam  Constitutional: He is oriented to person, place, and time. He appears well-nourished.  Cardiovascular: Normal rate and regular rhythm.   No murmur heard. Respiratory: Effort normal and breath sounds normal. He has no wheezes.  GI: Soft. Bowel sounds are normal. There is no tenderness.  Musculoskeletal: Normal range of motion.   Neurological: He is alert and oriented to person, place, and time.  Skin: Skin is warm and dry.  Psychiatric: He has a normal mood and affect. His behavior is normal. Judgment and thought content normal.     Assessment/Plan Anemia; leukocytosis Leukemia- prob myeloblastic Scheduled for BM bx now Pt aware of procedure benefits and risks and agreeable to proceed Consent signed and in chart  Hooper A 08/06/2013, 7:51 AM

## 2013-08-08 IMAGING — CT CT CHEST W/ CM
3 of 5 series · 14 of 32 positions shown, 19 images · IV contrast (water/omni  & 100ml omni 300)
Comparison: None.

CT CHEST

CLINICAL DATA: Fever of unknown origin.

CT CHEST, ABDOMEN AND PELVIS WITH CONTRAST
TECHNIQUE: Multidetector CT imaging of the chest, abdomen and
pelvis was performed following the standard protocol during bolus
administration of intravenous contrast.
Contrast:  100 ml Cmnipaque-FFF

[Series 2: chest/abd/pelvis · axial · 0.93mm/px · z∈[-608,-523]mm · 2 of 139 slices shown]
[im 18/139  soft-tissue]
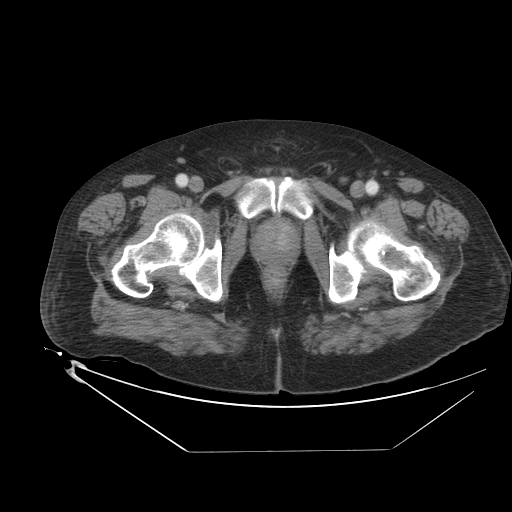
[im 35/139  soft-tissue]
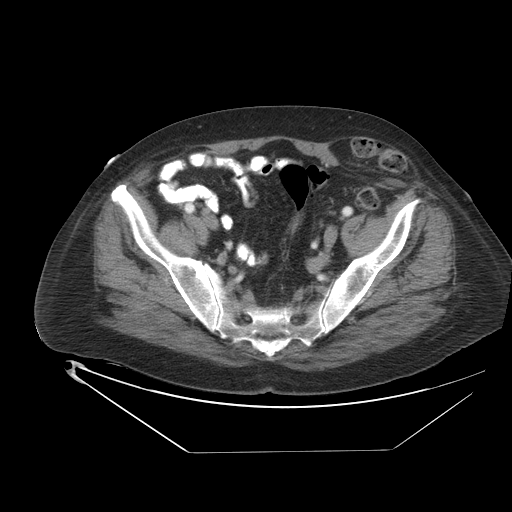

[Series 400: sagittals · sagittal · 1.37mm/px · 7 of 127 slices shown, 12 images]
[im 16/127  soft-tissue]
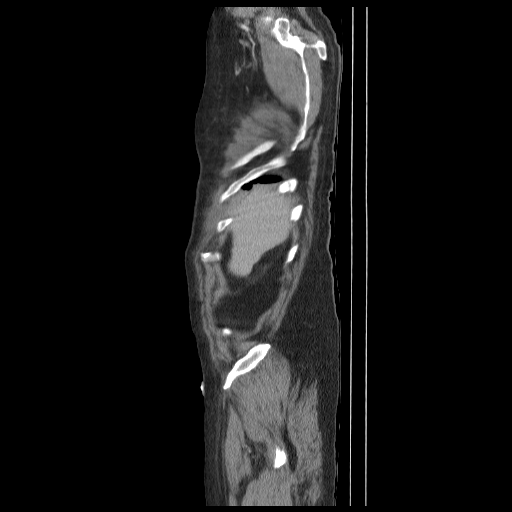
[im 16/127  lung]
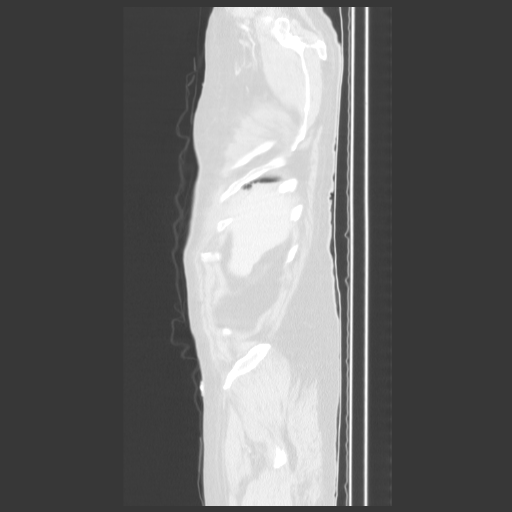
[im 16/127  bone]
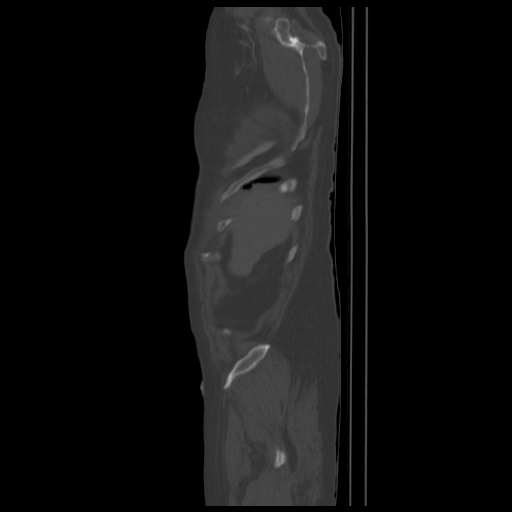
[im 32/127  soft-tissue]
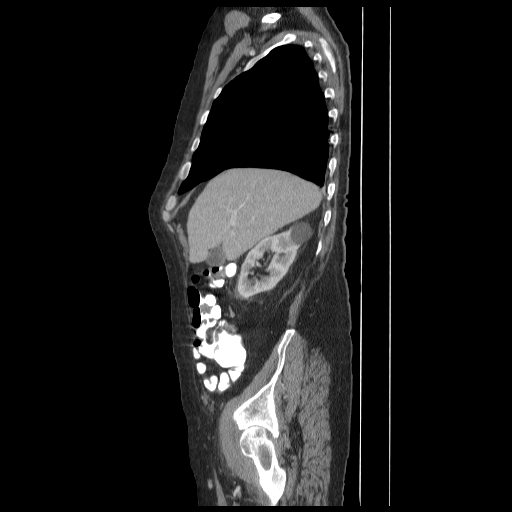
[im 32/127  lung]
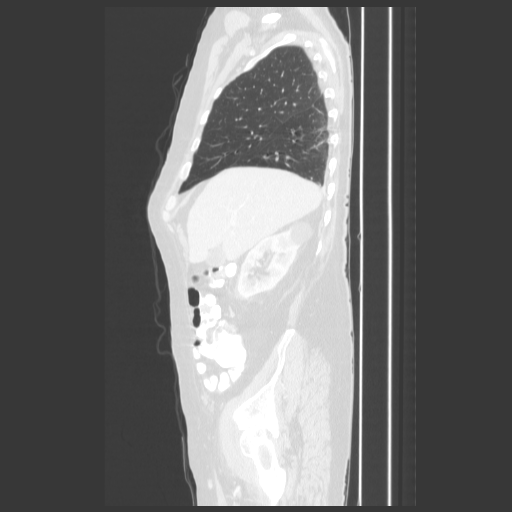
[im 48/127  soft-tissue]
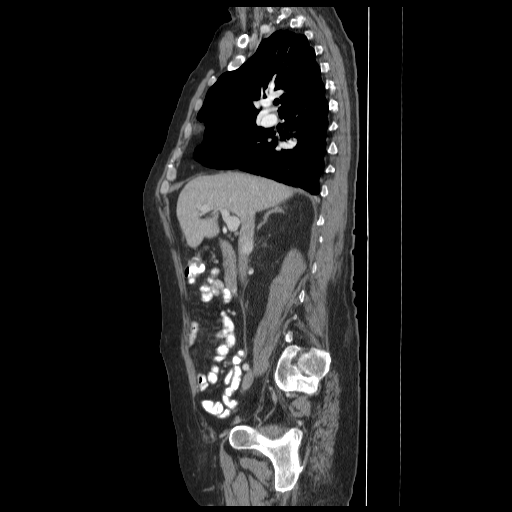
[im 48/127  lung]
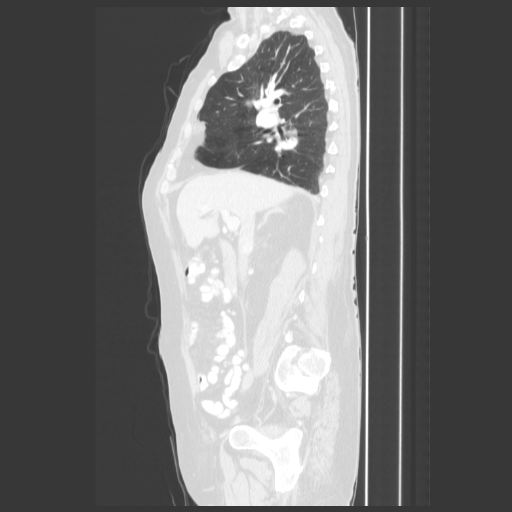
[im 64/127  soft-tissue]
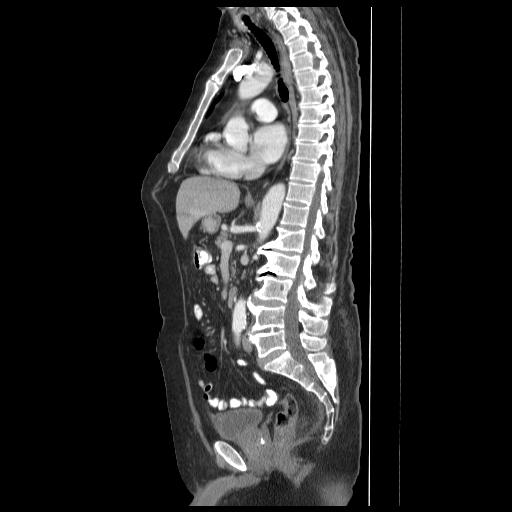
[im 64/127  lung]
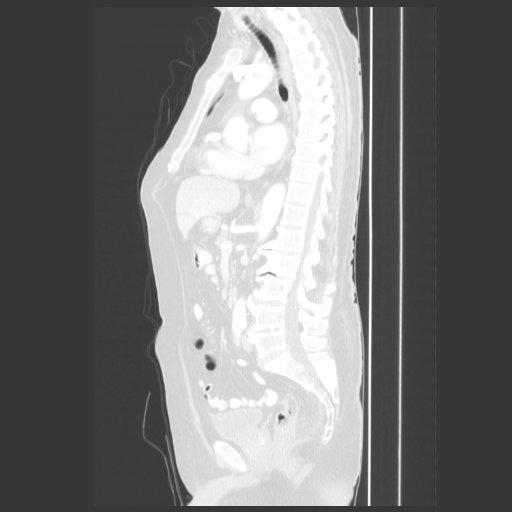
[im 79/127  soft-tissue]
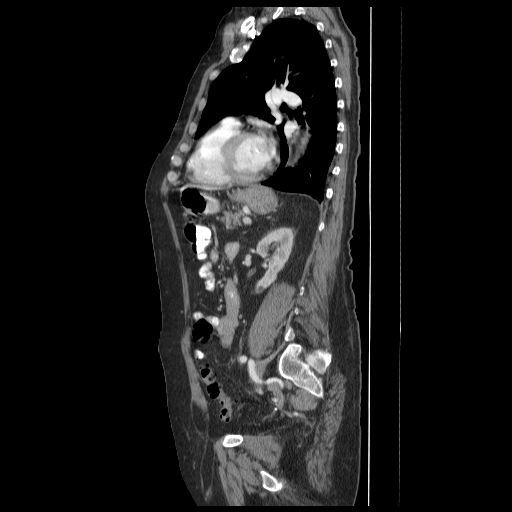
[im 95/127  soft-tissue]
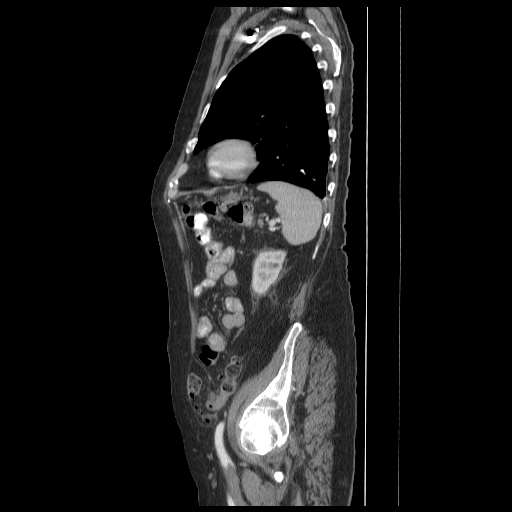
[im 111/127  soft-tissue]
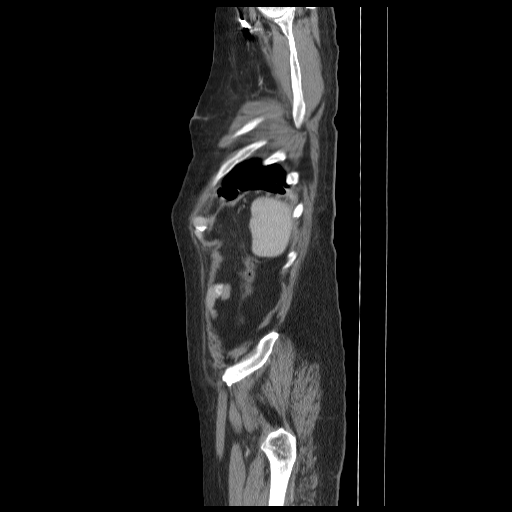

[Series 401: coronals · coronal · 1.37mm/px · 5 of 107 slices shown]
[im 18/107  soft-tissue]
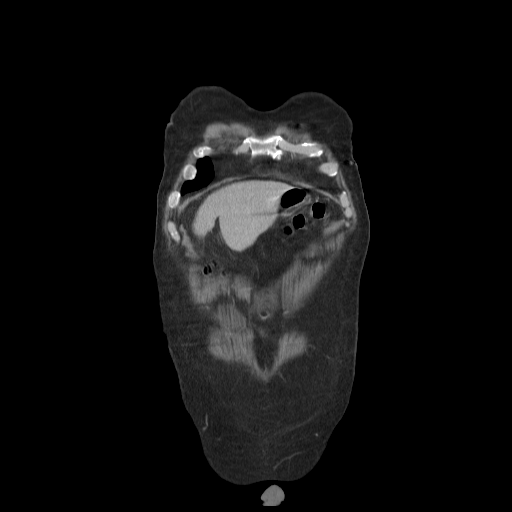
[im 36/107  soft-tissue]
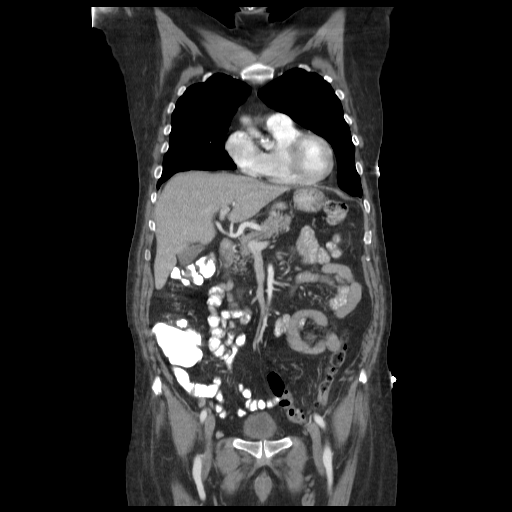
[im 54/107  soft-tissue]
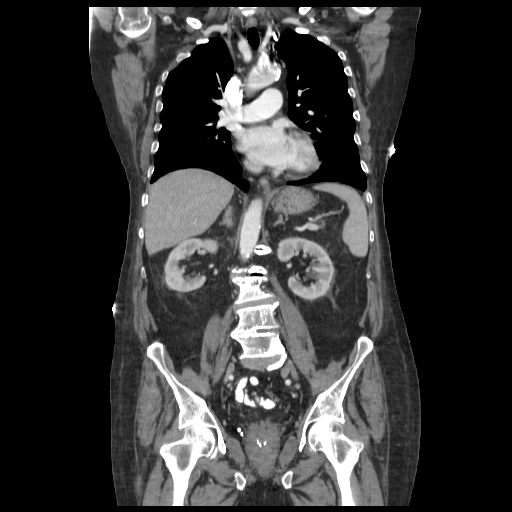
[im 71/107  soft-tissue]
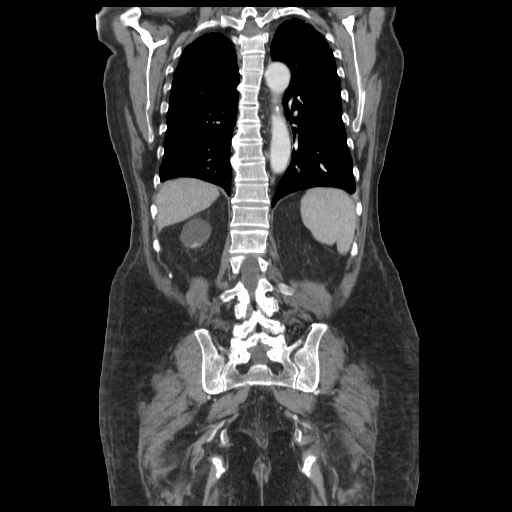
[im 89/107  soft-tissue]
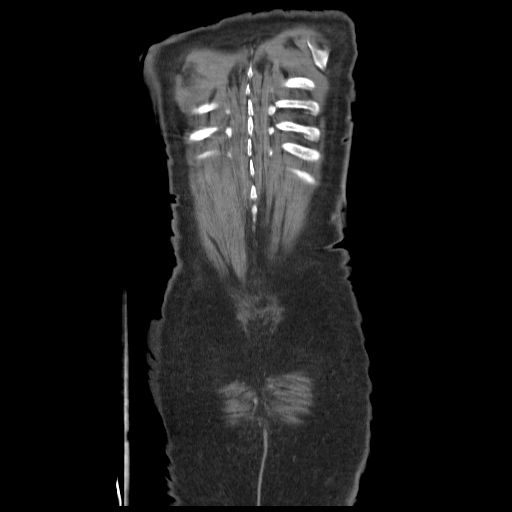

[14 of 32 positions shown; findings below may reference images not displayed]

FINDINGS: There is no axillary, mediastinal, or hilar
lymphadenopathy.  Heart size is at upper normal.  There is no
pericardial or pleural effusion.

There is some subtle ill-defined soft tissue attenuation in the
anterior mediastinal fat (see image 34 of series 2).  This is of
indeterminate significance.  Atelectasis is seen in the posterior
right lung base.  Left lung is clear.

Bone windows reveal no worrisome lytic or sclerotic osseous
lesions.
IMPRESSION: No acute findings in the chest.  There is some subtle soft tissue
attenuation in the anterior mediastinal fat it is indeterminate,
but likely benign.

CT ABDOMEN AND PELVIS
FINDINGS: No focal abnormalities seen in the liver.  Spleen is
unremarkable.  The stomach, duodenum, pancreas, gallbladder, and
adrenal glands are unremarkable.  4 cm water density lesion in the
right kidney is compatible with a cyst.  There is some cortical
scarring in the left kidney.

No abdominal aortic aneurysm.  There is no free fluid or
lymphadenopathy in the abdomen.  Abdominal bowel loops are
unremarkable.

Imaging through the pelvis shows no free intraperitoneal fluid.  No
pelvic sidewall lymphadenopathy.  Left lower quadrant hernia
contains a loop of sigmoid colon without complicating features.
There is diverticular change diffusely in the left colon without
diverticulitis.  Terminal ileum is unremarkable.  The appendix is
normal

Bone windows reveal no worrisome lytic or sclerotic osseous
lesions.
IMPRESSION: No findings to explain the patient's history of fever.

## 2013-08-09 ENCOUNTER — Telehealth: Payer: Self-pay | Admitting: Medical Oncology

## 2013-08-09 NOTE — Telephone Encounter (Signed)
Wendy-nurse with [facility] called stating that she needs orders to draw a CBC today. This order was placed in hospital discharge. She will draw and fax results to our office.

## 2013-08-10 ENCOUNTER — Telehealth: Payer: Self-pay

## 2013-08-10 NOTE — Telephone Encounter (Signed)
S/w daughter that Dr Alvy Bimler says it is OK to wait for Monday and ask Dr Juliann Mule when the next lab should be drawn. Talked about s/s of low hgb and to call earlier if pt has symptoms.

## 2013-08-11 ENCOUNTER — Telehealth: Payer: Self-pay | Admitting: *Deleted

## 2013-08-11 NOTE — Telephone Encounter (Signed)
Called patient to follow up on his status-Hgb today 8.1 on 08/09/13-stable compared to discharge Hgb 8.2. She does not feel he needs transfusion at this time. Results to MD desk. WBC improved at 49.7 and platelets 119.

## 2013-08-16 ENCOUNTER — Other Ambulatory Visit: Payer: Self-pay | Admitting: Internal Medicine

## 2013-08-16 ENCOUNTER — Telehealth: Payer: Self-pay | Admitting: Medical Oncology

## 2013-08-16 ENCOUNTER — Telehealth: Payer: Self-pay | Admitting: *Deleted

## 2013-08-16 DIAGNOSIS — C9200 Acute myeloblastic leukemia, not having achieved remission: Secondary | ICD-10-CM

## 2013-08-16 NOTE — H&P (Signed)
Blood transfusion only for 08/17/2013.

## 2013-08-16 NOTE — Telephone Encounter (Signed)
Butch Penny- Nurse with [facility] called stating that they are going to get the pt's blood drawn today but due to an emergency it will be too late to bring to our lab. They will send to [facility] and have it done stat. It will be faxed or called to our office.
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
## 2013-08-16 NOTE — Telephone Encounter (Signed)
I called Butch Penny and asked they take the labs to be resulted to [facility] due to the type and cross. I explained it would have to be redrawn if they take it somewhere else to be resulted. She voiced understanding.

## 2013-08-17 ENCOUNTER — Non-Acute Institutional Stay (HOSPITAL_COMMUNITY)
Admission: AD | Admit: 2013-08-17 | Discharge: 2013-08-17 | Disposition: A | Discharge status: Home / Self Care | Payer: Medicare Other | Source: Ambulatory Visit | Attending: Internal Medicine | Admitting: Internal Medicine

## 2013-08-17 ENCOUNTER — Ambulatory Visit (HOSPITAL_COMMUNITY)
Admission: RE | Admit: 2013-08-17 | Discharge: 2013-08-17 | Disposition: A | Discharge status: Home / Self Care | Source: Ambulatory Visit | Attending: Internal Medicine | Admitting: Internal Medicine

## 2013-08-17 ENCOUNTER — Telehealth: Payer: Self-pay

## 2013-08-17 DIAGNOSIS — C9200 Acute myeloblastic leukemia, not having achieved remission: Secondary | ICD-10-CM | POA: Insufficient documentation

## 2013-08-17 DIAGNOSIS — D649 Anemia, unspecified: Secondary | ICD-10-CM | POA: Insufficient documentation

## 2013-08-17 LAB — ABO/RH: ABO/RH(D): A NEG

## 2013-08-17 MED ORDER — DIPHENHYDRAMINE HCL 25 MG PO CAPS
25.0000 mg | ORAL_CAPSULE | Freq: Once | ORAL | Status: AC
  Administered 2013-08-17: 25 mg via ORAL
  Filled 2013-08-17: qty 1

## 2013-08-17 MED ORDER — ACETAMINOPHEN 325 MG PO TABS
650.0000 mg | ORAL_TABLET | Freq: Once | ORAL | Status: AC
  Administered 2013-08-17: 650 mg via ORAL
  Filled 2013-08-17: qty 2

## 2013-08-17 NOTE — Progress Notes (Signed)
Patient ID: Jorge Ball, male   DOB: [birth year], 80 y.o.   MRN: [id number] Jorge Ball  Procedure Note  Jorge Ball [id number] DOB: [birth year] DOA: 08/17/2013  Dr. 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    [name], RN  [facility]

## 2013-08-17 NOTE — H&P (Signed)
Blood transfusion only.   

## 2013-08-17 NOTE — Discharge Instructions (Signed)
Blood Transfusion Information WHAT IS A BLOOD TRANSFUSION? A transfusion is the replacement of blood or some of its parts. Blood is made up of multiple cells which provide different functions.  Red blood cells carry oxygen and are used for blood loss replacement.  White blood cells fight against infection.  Platelets control bleeding.  Plasma helps clot blood.  Other blood products are available for specialized needs, such as hemophilia or other clotting disorders. BEFORE THE TRANSFUSION  Who gives blood for transfusions?   You may be able to donate blood to be used at a later date on yourself (autologous donation).  Relatives can be asked to donate blood. This is generally not any safer than if you have received blood from a stranger. The same precautions are taken to ensure safety when a relative's blood is donated.  Healthy volunteers who are fully evaluated to make sure their blood is safe. This is blood bank blood. Transfusion therapy is the safest it has ever been in the practice of medicine. Before blood is taken from a donor, a complete history is taken to make sure that person has no history of diseases nor engages in risky social behavior (examples are intravenous drug use or sexual activity with multiple partners). The donor's travel history is screened to minimize risk of transmitting infections, such as malaria. The donated blood is tested for signs of infectious diseases, such as HIV and hepatitis. The blood is then tested to be sure it is compatible with you in order to minimize the chance of a transfusion reaction. If you or a relative donates blood, this is often done in anticipation of surgery and is not appropriate for emergency situations. It takes many days to process the donated blood. RISKS AND COMPLICATIONS Although transfusion therapy is very safe and saves many lives, the main dangers of transfusion include:   Getting an infectious disease.  Developing a  transfusion reaction. This is an allergic reaction to something in the blood you were given. Every precaution is taken to prevent this. The decision to have a blood transfusion has been considered carefully by your caregiver before blood is given. Blood is not given unless the benefits outweigh the risks. AFTER THE TRANSFUSION  Right after receiving a blood transfusion, you will usually feel much better and more energetic. This is especially true if your red blood cells have gotten low (anemic). The transfusion raises the level of the red blood cells which carry oxygen, and this usually causes an energy increase.  The nurse administering the transfusion will monitor you carefully for complications. HOME CARE INSTRUCTIONS  No special instructions are needed after a transfusion. You may find your energy is better. Speak with your caregiver about any limitations on activity for underlying diseases you may have. SEEK MEDICAL CARE IF:   Your condition is not improving after your transfusion.  You develop redness or irritation at the intravenous (IV) site. SEEK IMMEDIATE MEDICAL CARE IF:  Any of the following symptoms occur over the next 12 hours:  Shaking chills.  You have a temperature by mouth above 102 F (38.9 C), not controlled by medicine.  Chest, back, or muscle pain.  People around you feel you are not acting correctly or are confused.  Shortness of breath or difficulty breathing.  Dizziness and fainting.  You get a rash or develop hives.  You have a decrease in urine output.  Your urine turns a dark color or changes to pink, red, or brown. Any of the following   symptoms occur over the next 10 days:  You have a temperature by mouth above 102 F (38.9 C), not controlled by medicine.  Shortness of breath.  Weakness after normal activity.  The white part of the eye turns yellow (jaundice).  You have a decrease in the amount of urine or are urinating less often.  Your  urine turns a dark color or changes to pink, red, or brown. Document Released: 04/26/2000 Document Revised: 07/22/2011 Document Reviewed: 12/14/2007 Exit[employer] Patient Information 2014 [employer].  

## 2013-08-17 NOTE — Telephone Encounter (Signed)
S/w pt who asked that I call his wife. S/w wife and daughter. Set up blood transfusion for today [facility] 11 [facility].

## 2013-08-18 ENCOUNTER — Other Ambulatory Visit: Payer: Self-pay | Admitting: Internal Medicine

## 2013-08-18 ENCOUNTER — Telehealth: Payer: Self-pay

## 2013-08-18 DIAGNOSIS — C9500 Acute leukemia of unspecified cell type not having achieved remission: Secondary | ICD-10-CM

## 2013-08-18 LAB — TYPE AND SCREEN
ABO/RH(D): A NEG
ANTIBODY SCREEN: NEGATIVE
Unit division: 0
Unit division: 0

## 2013-08-18 NOTE — Telephone Encounter (Signed)
3 phone calls with Jorge Ball about possible PICC or PORT, after the differences were explained and the procedures were explained. Jorge Ball conferred with pt. He decided he did not want either. He will continue with peripheral iv starts for labs and blood transfusions, etc. I stated we can order a central line anytime he changes his mind. Dr Juliann Mule informed.

## 2013-08-18 NOTE — Telephone Encounter (Signed)
Answering call from 1348 pm. Family stated pt is willing for picc or port but that he wants to be able to shower and whatnot. S/w Dr Juliann Mule and he will order port placement. LVM that Dr Juliann Mule is ordering port placement and to expect a call from interventional radiology to schedule it.

## 2013-08-20 ENCOUNTER — Telehealth: Payer: Self-pay | Admitting: Internal Medicine

## 2013-08-20 NOTE — Telephone Encounter (Signed)
4/8 pof deleted due to 4/8 nurses not that pt does not want port at this time. also order no longer available. no other appts per pof.

## 2013-08-23 ENCOUNTER — Ambulatory Visit: Payer: Medicare Other

## 2013-08-23 ENCOUNTER — Other Ambulatory Visit: Payer: Self-pay | Admitting: Internal Medicine

## 2013-08-23 ENCOUNTER — Telehealth: Payer: Self-pay | Admitting: Medical Oncology

## 2013-08-23 ENCOUNTER — Other Ambulatory Visit (HOSPITAL_BASED_OUTPATIENT_CLINIC_OR_DEPARTMENT_OTHER): Payer: Medicare Other | Admitting: Medical Oncology

## 2013-08-23 DIAGNOSIS — D649 Anemia, unspecified: Secondary | ICD-10-CM

## 2013-08-23 DIAGNOSIS — C9500 Acute leukemia of unspecified cell type not having achieved remission: Secondary | ICD-10-CM

## 2013-08-23 LAB — CBC WITH DIFFERENTIAL/PLATELET
HEMATOCRIT: 23.2 % — AB (ref 38.4–49.9)
HGB: 7.3 g/dL — ABNORMAL LOW (ref 13.0–17.1)
MCH: 28.6 pg (ref 27.2–33.4)
MCHC: 31.4 g/dL — ABNORMAL LOW (ref 32.0–36.0)
MCV: 91.3 fL (ref 79.3–98.0)
PLATELETS: 85 10*3/uL — AB (ref 140–400)
RBC: 2.54 10*6/uL — ABNORMAL LOW (ref 4.20–5.82)
RDW: 18.4 % — ABNORMAL HIGH (ref 11.0–14.6)
WBC: 113.6 10*3/uL — AB (ref 4.0–10.3)

## 2013-08-23 LAB — MANUAL DIFFERENTIAL
ALC: 6.8 10*3/uL — ABNORMAL HIGH (ref 0.9–3.3)
ANC (CHCC manual diff): 78.4 10*3/uL — ABNORMAL HIGH (ref 1.5–6.5)
Band Neutrophils: 1 % (ref 0–10)
Basophil: 3 % — ABNORMAL HIGH (ref 0–2)
Blasts: 17 % — ABNORMAL HIGH (ref 0–0)
EOS%: 0 % (ref 0–7)
LYMPH: 6 % — ABNORMAL LOW (ref 14–49)
MONO: 2 % (ref 0–14)
Metamyelocytes: 7 % — ABNORMAL HIGH (ref 0–0)
Myelocytes: 39 % — ABNORMAL HIGH (ref 0–0)
OTHER CELL: 0 % (ref 0–0)
PLT EST: DECREASED
PROMYELO: 3 % — ABNORMAL HIGH (ref 0–0)
SEG: 22 % — AB (ref 38–77)
VARIANT LYMPH: 0 % (ref 0–0)
nRBC: 1 % — ABNORMAL HIGH (ref 0–0)

## 2013-08-23 LAB — TECHNOLOGIST REVIEW

## 2013-08-23 NOTE — Telephone Encounter (Signed)
I spoke with daughter Jorge Ball to inform her that her dad's Hgb is 7.3. Per Dr. Juliann Mule is her dad is symptomatic we will transfuse him. She states the only thing she has noticed is he gets really tired when he gets in the shower. He has a chair but he refuses to sit down and refuses to ride in the wheelchair to the shower. He has agreed to someone coming in to help him. She has not noticed any shortness of breath or any changes. [name]-nurse with hospice will be back to see pt on Thurs so if she feels pt needs blood we can always drawn another CBC. Jorge Ball is agreement. I did speak with Butch Penny regarding the above.

## 2013-08-26 ENCOUNTER — Other Ambulatory Visit: Payer: Self-pay | Admitting: Internal Medicine

## 2013-08-26 ENCOUNTER — Ambulatory Visit (HOSPITAL_BASED_OUTPATIENT_CLINIC_OR_DEPARTMENT_OTHER)

## 2013-08-26 ENCOUNTER — Telehealth: Payer: Self-pay | Admitting: Medical Oncology

## 2013-08-26 ENCOUNTER — Other Ambulatory Visit: Payer: Self-pay | Admitting: Medical Oncology

## 2013-08-26 DIAGNOSIS — C9200 Acute myeloblastic leukemia, not having achieved remission: Secondary | ICD-10-CM

## 2013-08-26 DIAGNOSIS — D649 Anemia, unspecified: Secondary | ICD-10-CM

## 2013-08-26 DIAGNOSIS — C9500 Acute leukemia of unspecified cell type not having achieved remission: Secondary | ICD-10-CM

## 2013-08-26 LAB — CBC WITH DIFFERENTIAL/PLATELET
HCT: 21.7 % — ABNORMAL LOW (ref 38.4–49.9)
HGB: 6.8 g/dL — CL (ref 13.0–17.1)
MCH: 28.3 pg (ref 27.2–33.4)
MCHC: 31.3 g/dL — ABNORMAL LOW (ref 32.0–36.0)
MCV: 90.3 fL (ref 79.3–98.0)
Platelets: 84 10*3/uL — ABNORMAL LOW (ref 140–400)
RBC: 2.4 10*6/uL — AB (ref 4.20–5.82)
RDW: 17.5 % — AB (ref 11.0–14.6)
WBC: 119.2 10*3/uL (ref 4.0–10.3)

## 2013-08-26 LAB — MANUAL DIFFERENTIAL
ALC: 8.3 10*3/uL — ABNORMAL HIGH (ref 0.9–3.3)
ANC (CHCC manual diff): 83.4 10*3/uL — ABNORMAL HIGH (ref 1.5–6.5)
BLASTS: 12 % — AB (ref 0–0)
Band Neutrophils: 6 % (ref 0–10)
LYMPH: 7 % — ABNORMAL LOW (ref 14–49)
METAMYELOCYTES PCT: 7 % — AB (ref 0–0)
MONO: 2 % (ref 0–14)
MYELOCYTES: 28 % — AB (ref 0–0)
PLT EST: DECREASED
PROMYELO: 9 % — AB (ref 0–0)
SEG: 29 % — AB (ref 38–77)

## 2013-08-26 LAB — HOLD TUBE, BLOOD BANK

## 2013-08-26 NOTE — H&P (Signed)
Blood transfusion only.   

## 2013-08-26 NOTE — Telephone Encounter (Signed)
I called Karen-daughter with results of Hgb 6.8. She states that she would like for her father to get blood.Per Dr. Juliann Mule we will give the pt 2 untits.  We will do the T&C today and he will receive blood tomorrow at 9 am in the [facility] clinic. I instructed her to make sure he keeps the blue bracelet on. She voiced understanding. Pt has received blood in the [facility] clinic before.

## 2013-08-27 ENCOUNTER — Non-Acute Institutional Stay (HOSPITAL_COMMUNITY)
Admission: AD | Admit: 2013-08-27 | Discharge: 2013-08-27 | Disposition: A | Discharge status: Home / Self Care | Payer: Medicare Other | Source: Ambulatory Visit | Attending: Internal Medicine | Admitting: Internal Medicine

## 2013-08-27 ENCOUNTER — Telehealth: Payer: Self-pay

## 2013-08-27 DIAGNOSIS — C9200 Acute myeloblastic leukemia, not having achieved remission: Secondary | ICD-10-CM

## 2013-08-27 LAB — PREPARE RBC (CROSSMATCH)

## 2013-08-27 MED ORDER — HEPARIN SOD (PORK) LOCK FLUSH 100 UNIT/ML IV SOLN: 250.0000 [IU] | INTRAVENOUS | Status: DC | PRN

## 2013-08-27 MED ORDER — ACETAMINOPHEN 325 MG PO TABS
650.0000 mg | ORAL_TABLET | Freq: Once | ORAL | Status: AC
  Administered 2013-08-27: 650 mg via ORAL
  Filled 2013-08-27: qty 2

## 2013-08-27 MED ORDER — DIPHENHYDRAMINE HCL 25 MG PO CAPS
25.0000 mg | ORAL_CAPSULE | Freq: Once | ORAL | Status: AC
  Administered 2013-08-27: 25 mg via ORAL
  Filled 2013-08-27: qty 1

## 2013-08-27 MED ORDER — SODIUM CHLORIDE 0.9 % IJ SOLN: 10.0000 mL | INTRAMUSCULAR | Status: DC | PRN

## 2013-08-27 MED ORDER — HEPARIN SOD (PORK) LOCK FLUSH 100 UNIT/ML IV SOLN: 500.0000 [IU] | Freq: Every day | INTRAVENOUS | Status: DC | PRN

## 2013-08-27 MED ORDER — SODIUM CHLORIDE 0.9 % IJ SOLN: 3.0000 mL | INTRAMUSCULAR | Status: DC | PRN

## 2013-08-27 MED ORDER — SODIUM CHLORIDE 0.9 % IV SOLN
250.0000 mL | Freq: Once | INTRAVENOUS | Status: AC
  Administered 2013-08-27: 250 mL via INTRAVENOUS

## 2013-08-27 NOTE — Telephone Encounter (Signed)
Answering Santiago Glad question from earlier in day if pt needs to continue prednisone. Per Dr Juliann Mule, pt may stop prednisone. She then brought up question about red areas showing on his big toes. [facility]. Said they could have RN there look at his toes and contact us if needed.

## 2013-08-27 NOTE — H&P (Signed)
Blood transfusion only.   

## 2013-08-27 NOTE — Procedures (Signed)
[facility]  Procedure Note  Jorge Ball [id number] DOB: 1926/10/01 DOA: 08/27/2013   PCP: Dr. Juliann Mule   Associated Diagnosis: acute myeloid leukemia  Procedure Note: IV started, 2 units PRBC's transfused per order   Condition During Procedure: patient denies any complaints   Condition at [place], patient assisted to wheelchair, transported to vehicle, family at side.   [name], RN  [facility]

## 2013-08-29 LAB — TYPE AND SCREEN
ABO/RH(D): A NEG
ANTIBODY SCREEN: NEGATIVE
UNIT DIVISION: 0
Unit division: 0

## 2013-08-30 ENCOUNTER — Telehealth: Payer: Self-pay

## 2013-08-30 NOTE — Telephone Encounter (Signed)
S/w Butch Penny from Hospice. Per Dr Juliann Mule the need to draw CBC could be today or Thurs based on pt symptomatology and pt desires. Butch Penny will be seeing pt later today.

## 2013-08-31 ENCOUNTER — Other Ambulatory Visit: Payer: Self-pay | Admitting: Medical Oncology

## 2013-08-31 DIAGNOSIS — D649 Anemia, unspecified: Secondary | ICD-10-CM

## 2013-09-02 ENCOUNTER — Other Ambulatory Visit: Payer: Self-pay | Admitting: Internal Medicine

## 2013-09-02 ENCOUNTER — Telehealth: Payer: Self-pay | Admitting: *Deleted

## 2013-09-02 DIAGNOSIS — C9200 Acute myeloblastic leukemia, not having achieved remission: Secondary | ICD-10-CM

## 2013-09-02 NOTE — Telephone Encounter (Signed)
ANOTHER NURSE WILL TRY TO DRAW PT.'S BLOOD FOR THE CBC WITH DIFF AND TYPE & HOLD. HOWEVER SHE WILL NOT BE ABLE TO GET THE BLOOD TO THE [facility] BY 4:30PM. COULD TAKE THE BLOOD TO [place] [facility]. NOTIFIED DR.CHISM. HE WILL PUT THE ORDERS IN THE SYSTEM FOR [facility].

## 2013-09-03 ENCOUNTER — Telehealth: Payer: Self-pay

## 2013-09-03 LAB — TYPE AND SCREEN
ABO/RH(D): A NEG
Antibody Screen: NEGATIVE

## 2013-09-03 NOTE — Telephone Encounter (Signed)
hgb 8.2 on 4/23, hgb 6.8 on 4/16 with 2 units blood of 4/17, is weaker, is on O2 now. Hospice is asking pt if he wants a foley, and if he wants to transfer to [facility]. S/w daughter and she asked pt if he wants to get blood today at [facility]. He said "no". Further talking with Santiago Glad about end of life choices: the pt said "I don't know how to die". This information passed to hospice. Hospice social worker in conferring with [facility] hospice RN today.

## 2013-09-03 NOTE — Telephone Encounter (Signed)
error 

## 2013-09-06 ENCOUNTER — Telehealth: Payer: Self-pay | Admitting: Medical Oncology

## 2013-09-06 NOTE — Telephone Encounter (Signed)
[name]-RN with hospice called to inform us that she and the social worker are in the home with the patient. Pt does not want to get any further transfusions at this time. He stated he can not feel any different. Pt wants to stay home but his wife is unable to care for him. He has agreed to be place on the waiting list for [facility]. Butch Penny will keep [place] updated. Dr. Juliann Mule made aware of the above.

## 2013-09-26 ENCOUNTER — Encounter: Payer: Self-pay | Admitting: Internal Medicine

## 2013-10-11 DEATH — deceased

## 2015-05-08 IMAGING — DX DG CHEST 1V PORT
1 series · 1 of 1 positions shown · non-contrast
Comparison: 05/18/2012

CLINICAL DATA: Fatigue.

EXAM:
PORTABLE CHEST - 1 VIEW

[portable]
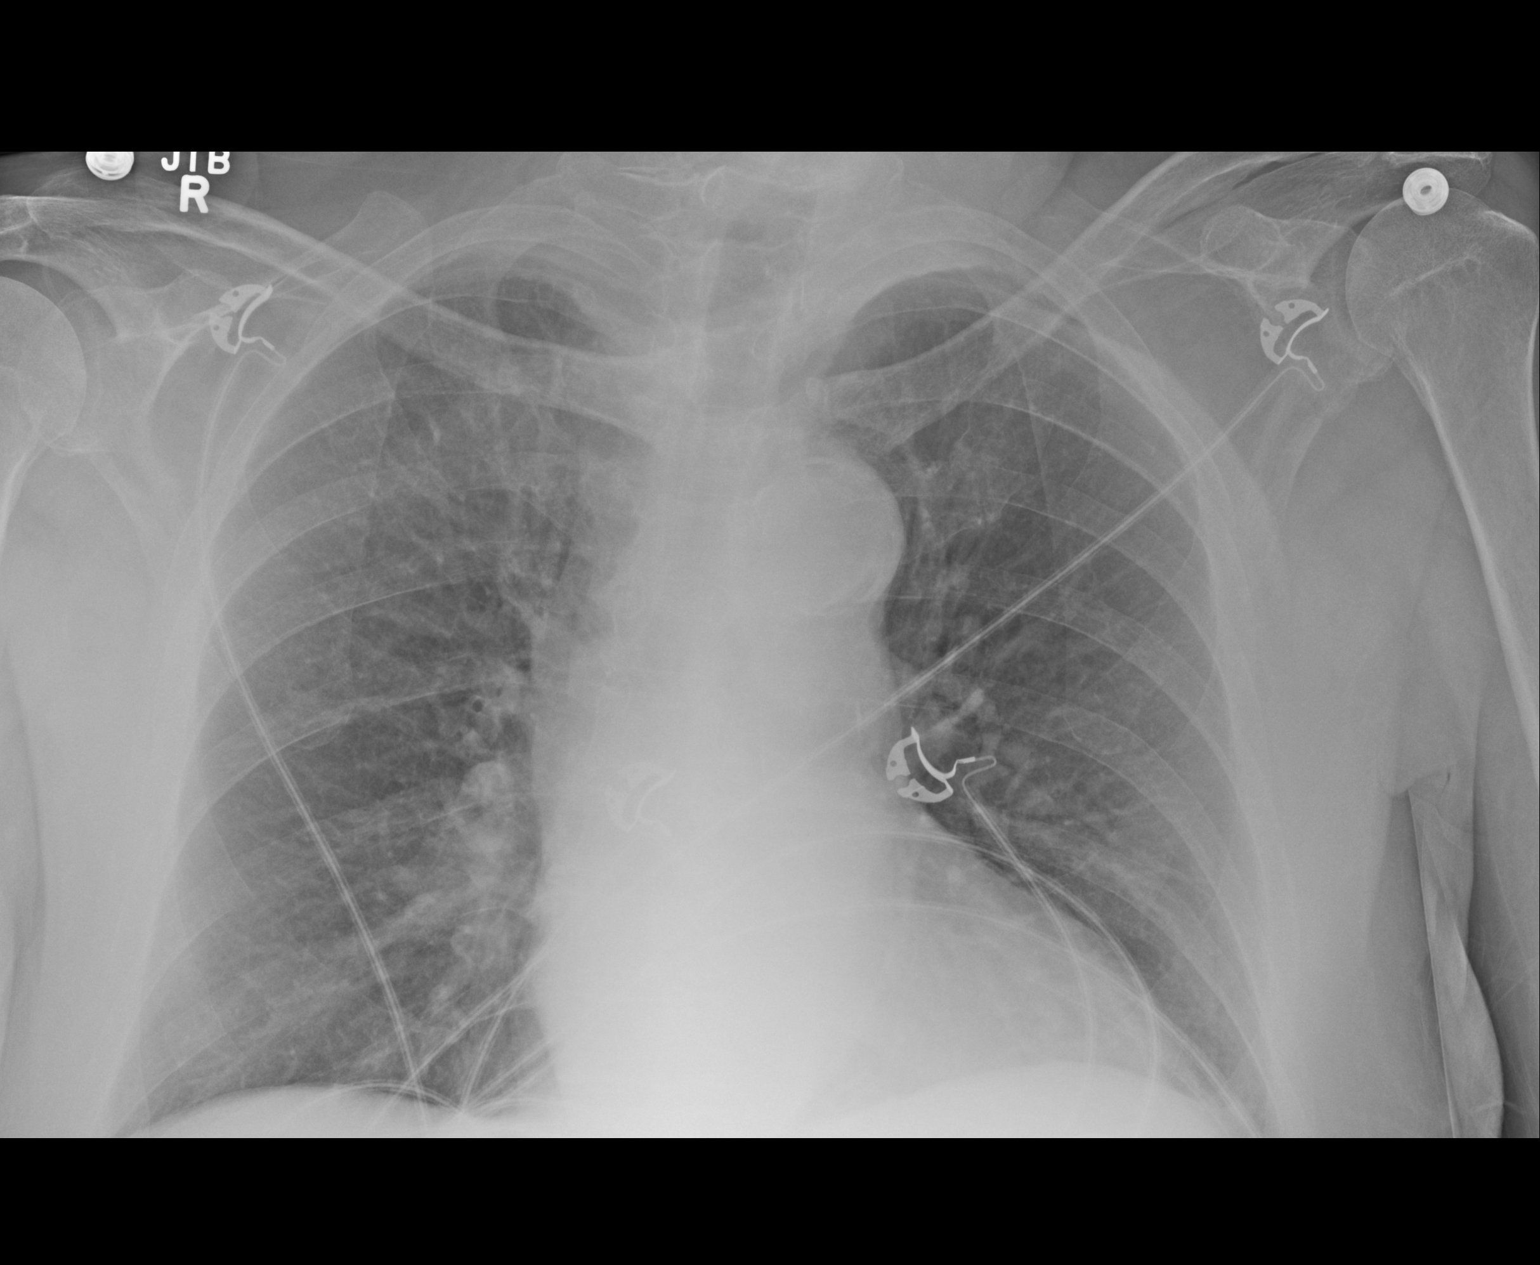

[1 of 1 positions shown; findings below may reference images not displayed]

FINDINGS: Cardiac silhouette is normal in size and configuration. Aorta is
mildly uncoiled. No mediastinal or hilar masses. Clear lungs. No
pleural effusion or pneumothorax.

Bony thorax is grossly intact.
IMPRESSION: No active disease.
